# Patient Record
Sex: Male | Born: 1970 | Race: Black or African American | Hispanic: No | Marital: Married | Smoking: Current every day smoker
Health system: Southern US, Community
[De-identification: ages and names within clinical notes are randomized; demographics above are authoritative.]

## PROBLEM LIST (undated history)

## (undated) DIAGNOSIS — K219 Gastro-esophageal reflux disease without esophagitis: Secondary | ICD-10-CM

## (undated) DIAGNOSIS — F1910 Other psychoactive substance abuse, uncomplicated: Secondary | ICD-10-CM

## (undated) DIAGNOSIS — F1011 Alcohol abuse, in remission: Secondary | ICD-10-CM

## (undated) DIAGNOSIS — F32A Depression, unspecified: Secondary | ICD-10-CM

## (undated) HISTORY — PX: MOUTH SURGERY: SHX715

---

## 2007-01-18 ENCOUNTER — Emergency Department: Payer: Self-pay | Admitting: Emergency Medicine

## 2008-01-22 ENCOUNTER — Emergency Department: Payer: Self-pay

## 2008-08-28 ENCOUNTER — Emergency Department: Payer: Self-pay | Admitting: Emergency Medicine

## 2008-09-15 ENCOUNTER — Emergency Department: Payer: Self-pay | Admitting: Internal Medicine

## 2008-11-23 ENCOUNTER — Inpatient Hospital Stay: Payer: Self-pay | Admitting: Psychiatry

## 2009-02-23 DEATH — deceased

## 2009-04-08 ENCOUNTER — Inpatient Hospital Stay: Payer: Self-pay | Admitting: Unknown Physician Specialty

## 2009-07-22 ENCOUNTER — Emergency Department: Payer: Self-pay | Admitting: Internal Medicine

## 2009-07-23 ENCOUNTER — Emergency Department: Payer: Self-pay | Admitting: Emergency Medicine

## 2009-08-26 ENCOUNTER — Inpatient Hospital Stay: Payer: Self-pay | Admitting: Unknown Physician Specialty

## 2010-02-24 ENCOUNTER — Emergency Department (HOSPITAL_COMMUNITY)
Admission: EM | Admit: 2010-02-24 | Discharge: 2010-02-24 | Payer: Self-pay | Source: Home / Self Care | Admitting: Emergency Medicine

## 2010-03-04 ENCOUNTER — Emergency Department: Payer: Self-pay | Admitting: Emergency Medicine

## 2010-04-10 ENCOUNTER — Emergency Department: Payer: Self-pay | Admitting: Emergency Medicine

## 2010-04-14 ENCOUNTER — Inpatient Hospital Stay: Payer: Self-pay | Admitting: Psychiatry

## 2010-07-06 ENCOUNTER — Emergency Department: Payer: Self-pay | Admitting: Emergency Medicine

## 2010-09-14 ENCOUNTER — Emergency Department: Payer: Self-pay | Admitting: Emergency Medicine

## 2010-10-07 ENCOUNTER — Emergency Department: Payer: Self-pay | Admitting: Emergency Medicine

## 2010-10-18 ENCOUNTER — Emergency Department (HOSPITAL_COMMUNITY)
Admission: EM | Admit: 2010-10-18 | Discharge: 2010-10-18 | Disposition: A | Discharge status: Home / Self Care | Payer: Self-pay | Attending: Emergency Medicine | Admitting: Emergency Medicine

## 2010-10-18 DIAGNOSIS — F3289 Other specified depressive episodes: Secondary | ICD-10-CM | POA: Insufficient documentation

## 2010-10-18 DIAGNOSIS — S335XXA Sprain of ligaments of lumbar spine, initial encounter: Secondary | ICD-10-CM | POA: Insufficient documentation

## 2010-10-18 DIAGNOSIS — M545 Low back pain, unspecified: Secondary | ICD-10-CM | POA: Insufficient documentation

## 2010-10-18 DIAGNOSIS — W108XXA Fall (on) (from) other stairs and steps, initial encounter: Secondary | ICD-10-CM | POA: Insufficient documentation

## 2010-10-18 DIAGNOSIS — S5010XA Contusion of unspecified forearm, initial encounter: Secondary | ICD-10-CM | POA: Insufficient documentation

## 2010-10-18 DIAGNOSIS — Y92009 Unspecified place in unspecified non-institutional (private) residence as the place of occurrence of the external cause: Secondary | ICD-10-CM | POA: Insufficient documentation

## 2010-10-18 DIAGNOSIS — R209 Unspecified disturbances of skin sensation: Secondary | ICD-10-CM | POA: Insufficient documentation

## 2010-10-18 DIAGNOSIS — F329 Major depressive disorder, single episode, unspecified: Secondary | ICD-10-CM | POA: Insufficient documentation

## 2010-10-19 ENCOUNTER — Emergency Department: Payer: Self-pay | Admitting: Emergency Medicine

## 2010-11-09 ENCOUNTER — Emergency Department: Payer: Self-pay | Admitting: *Deleted

## 2010-11-09 ENCOUNTER — Emergency Department: Payer: Self-pay | Admitting: Emergency Medicine

## 2010-11-28 ENCOUNTER — Emergency Department: Payer: Self-pay | Admitting: Internal Medicine

## 2010-12-03 ENCOUNTER — Emergency Department: Payer: Self-pay | Admitting: *Deleted

## 2010-12-14 ENCOUNTER — Emergency Department: Payer: Self-pay | Admitting: *Deleted

## 2011-03-07 ENCOUNTER — Inpatient Hospital Stay: Payer: Self-pay | Admitting: Psychiatry

## 2011-03-07 LAB — CBC
HCT: 43.1 % (ref 40.0–52.0)
MCHC: 33.1 g/dL (ref 32.0–36.0)
MCV: 85 fL (ref 80–100)
RDW: 14.3 % (ref 11.5–14.5)

## 2011-03-07 LAB — COMPREHENSIVE METABOLIC PANEL
Albumin: 4.1 g/dL (ref 3.4–5.0)
Alkaline Phosphatase: 40 U/L — ABNORMAL LOW (ref 50–136)
Anion Gap: 9 (ref 7–16)
Calcium, Total: 8.3 mg/dL — ABNORMAL LOW (ref 8.5–10.1)
Co2: 29 mmol/L (ref 21–32)
EGFR (Non-African Amer.): >60
Glucose: 96 mg/dL (ref 65–99)
Osmolality: 285 (ref 275–301)
Potassium: 4 mmol/L (ref 3.5–5.1)
SGOT(AST): 23 U/L (ref 15–37)
Sodium: 144 mmol/L (ref 136–145)

## 2011-03-07 LAB — DRUG SCREEN, URINE
Amphetamines, Ur Screen: NEGATIVE (ref ?–1000)
Benzodiazepine, Ur Scrn: NEGATIVE (ref ?–200)
Cannabinoid 50 Ng, Ur ~~LOC~~: NEGATIVE (ref ?–50)
Cocaine Metabolite,Ur ~~LOC~~: NEGATIVE (ref ?–300)
MDMA (Ecstasy)Ur Screen: NEGATIVE (ref ?–500)
Opiate, Ur Screen: NEGATIVE (ref ?–300)
Phencyclidine (PCP) Ur S: NEGATIVE (ref ?–25)

## 2011-03-07 LAB — TSH: Thyroid Stimulating Horm: 0.32 u[IU]/mL — ABNORMAL LOW

## 2011-03-07 LAB — ACETAMINOPHEN LEVEL: Acetaminophen: <2 ug/mL

## 2011-03-10 LAB — LIPID PANEL
Cholesterol: 204 mg/dL — ABNORMAL HIGH (ref 0–200)
HDL Cholesterol: 62 mg/dL — ABNORMAL HIGH (ref 40–60)

## 2011-03-10 LAB — FOLATE: Folic Acid: 15.6 ng/mL (ref 3.1–100.0)

## 2011-04-25 ENCOUNTER — Emergency Department: Payer: Self-pay | Admitting: Emergency Medicine

## 2011-05-01 ENCOUNTER — Emergency Department: Payer: Self-pay | Admitting: Emergency Medicine

## 2011-05-01 LAB — COMPREHENSIVE METABOLIC PANEL
Anion Gap: 8 (ref 7–16)
Bilirubin,Total: 0.4 mg/dL (ref 0.2–1.0)
Calcium, Total: 8.6 mg/dL (ref 8.5–10.1)
Chloride: 107 mmol/L (ref 98–107)
Co2: 30 mmol/L (ref 21–32)
Creatinine: 1.02 mg/dL (ref 0.60–1.30)
EGFR (African American): >60
EGFR (Non-African Amer.): >60
Glucose: 92 mg/dL (ref 65–99)
Osmolality: 287 (ref 275–301)
SGOT(AST): 31 U/L (ref 15–37)
SGPT (ALT): 27 U/L

## 2011-05-01 LAB — ETHANOL: Ethanol %: 0.039 % (ref 0.000–0.080)

## 2011-05-01 LAB — CBC
MCH: 27.7 pg (ref 26.0–34.0)
Platelet: 214 10*3/uL (ref 150–440)
RBC: 5.15 10*6/uL (ref 4.40–5.90)
RDW: 15.5 % — ABNORMAL HIGH (ref 11.5–14.5)
WBC: 4.5 10*3/uL (ref 3.8–10.6)

## 2011-05-01 LAB — DRUG SCREEN, URINE
Barbiturates, Ur Screen: NEGATIVE (ref ?–200)
Cannabinoid 50 Ng, Ur ~~LOC~~: NEGATIVE (ref ?–50)
Cocaine Metabolite,Ur ~~LOC~~: NEGATIVE (ref ?–300)
MDMA (Ecstasy)Ur Screen: NEGATIVE (ref ?–500)
Methadone, Ur Screen: NEGATIVE (ref ?–300)
Opiate, Ur Screen: NEGATIVE (ref ?–300)
Phencyclidine (PCP) Ur S: NEGATIVE (ref ?–25)

## 2011-05-01 LAB — SALICYLATE LEVEL: Salicylates, Serum: <1.7 mg/dL

## 2011-05-01 LAB — RAPID HIV-1/2 QL/CONFIRM: HIV-1/2,Rapid Ql: NEGATIVE

## 2011-05-01 LAB — ACETAMINOPHEN LEVEL: Acetaminophen: <2 ug/mL

## 2011-05-26 ENCOUNTER — Emergency Department: Payer: Self-pay | Admitting: Emergency Medicine

## 2011-05-26 LAB — CBC
HGB: 13.8 g/dL (ref 13.0–18.0)
MCHC: 32.6 g/dL (ref 32.0–36.0)
MCV: 85 fL (ref 80–100)
Platelet: 182 10*3/uL (ref 150–440)
RBC: 4.96 10*6/uL (ref 4.40–5.90)
RDW: 15.3 % — ABNORMAL HIGH (ref 11.5–14.5)

## 2011-05-26 LAB — BASIC METABOLIC PANEL
Anion Gap: 9 (ref 7–16)
BUN: 4 mg/dL — ABNORMAL LOW (ref 7–18)
Calcium, Total: 8.4 mg/dL — ABNORMAL LOW (ref 8.5–10.1)
Co2: 26 mmol/L (ref 21–32)
Creatinine: 1.1 mg/dL (ref 0.60–1.30)
Glucose: 100 mg/dL — ABNORMAL HIGH (ref 65–99)
Osmolality: 276 (ref 275–301)
Potassium: 4 mmol/L (ref 3.5–5.1)

## 2011-05-26 LAB — DRUG SCREEN, URINE
Amphetamines, Ur Screen: NEGATIVE (ref ?–1000)
Barbiturates, Ur Screen: NEGATIVE (ref ?–200)
Benzodiazepine, Ur Scrn: NEGATIVE (ref ?–200)
Cocaine Metabolite,Ur ~~LOC~~: POSITIVE (ref ?–300)
MDMA (Ecstasy)Ur Screen: NEGATIVE (ref ?–500)
Methadone, Ur Screen: NEGATIVE (ref ?–300)
Opiate, Ur Screen: NEGATIVE (ref ?–300)
Tricyclic, Ur Screen: NEGATIVE (ref ?–1000)

## 2011-06-01 ENCOUNTER — Emergency Department: Payer: Self-pay

## 2011-06-01 LAB — CBC
HCT: 47.7 % (ref 40.0–52.0)
HGB: 15.4 g/dL (ref 13.0–18.0)
MCH: 27.6 pg (ref 26.0–34.0)
MCV: 86 fL (ref 80–100)
Platelet: 245 10*3/uL (ref 150–440)
RBC: 5.56 10*6/uL (ref 4.40–5.90)
RDW: 15.8 % — ABNORMAL HIGH (ref 11.5–14.5)
WBC: 8.5 10*3/uL (ref 3.8–10.6)

## 2011-06-01 LAB — URINALYSIS, COMPLETE
Bilirubin,UR: NEGATIVE
Blood: NEGATIVE
Hyaline Cast: 1
Leukocyte Esterase: NEGATIVE
Nitrite: NEGATIVE
Protein: NEGATIVE
Squamous Epithelial: NONE SEEN
WBC UR: 1 /HPF (ref 0–5)

## 2011-06-01 LAB — COMPREHENSIVE METABOLIC PANEL
Albumin: 4.9 g/dL (ref 3.4–5.0)
BUN: 7 mg/dL (ref 7–18)
Calcium, Total: 9.8 mg/dL (ref 8.5–10.1)
Chloride: 98 mmol/L (ref 98–107)
Potassium: 4.2 mmol/L (ref 3.5–5.1)
SGOT(AST): 24 U/L (ref 15–37)
SGPT (ALT): 24 U/L
Total Protein: 8.7 g/dL — ABNORMAL HIGH (ref 6.4–8.2)

## 2011-06-01 LAB — ETHANOL: Ethanol %: <0.003 % (ref 0.000–0.080)

## 2011-06-01 LAB — DRUG SCREEN, URINE
Amphetamines, Ur Screen: NEGATIVE (ref ?–1000)
Cocaine Metabolite,Ur ~~LOC~~: NEGATIVE (ref ?–300)
MDMA (Ecstasy)Ur Screen: NEGATIVE (ref ?–500)
Opiate, Ur Screen: NEGATIVE (ref ?–300)
Phencyclidine (PCP) Ur S: NEGATIVE (ref ?–25)
Tricyclic, Ur Screen: NEGATIVE (ref ?–1000)

## 2011-06-04 ENCOUNTER — Inpatient Hospital Stay: Payer: Self-pay | Admitting: Psychiatry

## 2011-06-04 LAB — SALICYLATE LEVEL: Salicylates, Serum: <1.7 mg/dL

## 2011-06-04 LAB — CBC
HCT: 48.8 % (ref 40.0–52.0)
HGB: 15.9 g/dL (ref 13.0–18.0)
MCV: 85 fL (ref 80–100)
Platelet: 200 10*3/uL (ref 150–440)
RBC: 5.77 10*6/uL (ref 4.40–5.90)
RDW: 15.5 % — ABNORMAL HIGH (ref 11.5–14.5)
WBC: 6.7 10*3/uL (ref 3.8–10.6)

## 2011-06-04 LAB — COMPREHENSIVE METABOLIC PANEL
Albumin: 4.5 g/dL (ref 3.4–5.0)
Bilirubin,Total: 0.4 mg/dL (ref 0.2–1.0)
Calcium, Total: 9.1 mg/dL (ref 8.5–10.1)
Creatinine: 0.99 mg/dL (ref 0.60–1.30)
EGFR (African American): >60
EGFR (Non-African Amer.): >60
Glucose: 93 mg/dL (ref 65–99)
Osmolality: 273 (ref 275–301)
SGOT(AST): 20 U/L (ref 15–37)
SGPT (ALT): 23 U/L
Sodium: 138 mmol/L (ref 136–145)
Total Protein: 8.5 g/dL — ABNORMAL HIGH (ref 6.4–8.2)

## 2011-06-04 LAB — ACETAMINOPHEN LEVEL: Acetaminophen: <2 ug/mL

## 2011-06-04 LAB — ETHANOL: Ethanol %: 0.075 % (ref 0.000–0.080)

## 2011-06-04 LAB — DRUG SCREEN, URINE
Benzodiazepine, Ur Scrn: NEGATIVE (ref ?–200)
Cannabinoid 50 Ng, Ur ~~LOC~~: NEGATIVE (ref ?–50)
Cocaine Metabolite,Ur ~~LOC~~: NEGATIVE (ref ?–300)
MDMA (Ecstasy)Ur Screen: NEGATIVE (ref ?–500)
Methadone, Ur Screen: NEGATIVE (ref ?–300)
Tricyclic, Ur Screen: NEGATIVE (ref ?–1000)

## 2011-06-09 LAB — LIPID PANEL
HDL Cholesterol: 43 mg/dL (ref 40–60)
Ldl Cholesterol, Calc: 108 mg/dL — ABNORMAL HIGH (ref 0–100)
Triglycerides: 202 mg/dL — ABNORMAL HIGH (ref 0–200)
VLDL Cholesterol, Calc: 40 mg/dL (ref 5–40)

## 2011-09-21 ENCOUNTER — Emergency Department: Payer: Self-pay | Admitting: Emergency Medicine

## 2011-10-06 ENCOUNTER — Emergency Department: Payer: Self-pay | Admitting: Emergency Medicine

## 2011-10-07 ENCOUNTER — Encounter (HOSPITAL_COMMUNITY): Payer: Self-pay | Admitting: *Deleted

## 2011-10-07 ENCOUNTER — Emergency Department (HOSPITAL_COMMUNITY)
Admission: EM | Admit: 2011-10-07 | Discharge: 2011-10-07 | Disposition: A | Discharge status: Home / Self Care | Payer: Self-pay | Attending: Emergency Medicine | Admitting: Emergency Medicine

## 2011-10-07 DIAGNOSIS — M5380 Other specified dorsopathies, site unspecified: Secondary | ICD-10-CM | POA: Insufficient documentation

## 2011-10-07 DIAGNOSIS — M62830 Muscle spasm of back: Secondary | ICD-10-CM

## 2011-10-07 MED ORDER — HYDROCODONE-ACETAMINOPHEN 5-325 MG PO TABS: ORAL_TABLET | ORAL | Status: AC

## 2011-10-07 MED ORDER — IBUPROFEN 400 MG PO TABS
600.0000 mg | ORAL_TABLET | Freq: Once | ORAL | Status: AC
  Administered 2011-10-07: 600 mg via ORAL
  Filled 2011-10-07: qty 1

## 2011-10-07 MED ORDER — DIAZEPAM 5 MG PO TABS: 5.0000 mg | ORAL_TABLET | Freq: Three times a day (TID) | ORAL | Status: AC | PRN

## 2011-10-07 NOTE — Discharge Instructions (Signed)
Lumbosacral Strain Lumbosacral strain is one of the most common causes of back pain. There are many causes of back pain. Most are not serious conditions. CAUSES  Your backbone (spinal column) is made up of 24 main vertebral bodies, the sacrum, and the coccyx. These are held together by muscles and tough, fibrous tissue (ligaments). Nerve roots pass through the openings between the vertebrae. A sudden move or injury to the back may cause injury to, or pressure on, these nerves. This may result in localized back pain or pain movement (radiation) into the buttocks, down the leg, and into the foot. Sharp, shooting pain from the buttock down the back of the leg (sciatica) is frequently associated with a ruptured (herniated) disk. Pain may be caused by muscle spasm alone. Your caregiver can often find the cause of your pain by the details of your symptoms and an exam. In some cases, you may need tests (such as X-rays). Your caregiver will work with you to decide if any tests are needed based on your specific exam. HOME CARE INSTRUCTIONS   Avoid an underactive lifestyle. Active exercise, as directed by your caregiver, is your greatest weapon against back pain.   Avoid hard physical activities (tennis, racquetball, waterskiing) if you are not in proper physical condition for it. This may aggravate or create problems.   If you have a back problem, avoid sports requiring sudden body movements. Swimming and walking are generally safer activities.   Maintain good posture.   Avoid becoming overweight (obese).   Use bed rest for only the most extreme, sudden (acute) episode. Your caregiver will help you determine how much bed rest is necessary.   For acute conditions, you may put ice on the injured area.   Put ice in a plastic bag.   Place a towel between your skin and the bag.   Leave the ice on for 15 to 20 minutes at a time, every 2 hours, or as needed.   After you are improved and more active, it  may help to apply heat for 30 minutes before activities.  See your caregiver if you are having pain that lasts longer than expected. Your caregiver can advise appropriate exercises or therapy if needed. With conditioning, most back problems can be avoided. SEEK IMMEDIATE MEDICAL CARE IF:   You have numbness, tingling, weakness, or problems with the use of your arms or legs.   You experience severe back pain not relieved with medicines.   There is a change in bowel or bladder control.   You have increasing pain in any area of the body, including your belly (abdomen).   You notice shortness of breath, dizziness, or feel faint.   You feel sick to your stomach (nauseous), are throwing up (vomiting), or become sweaty.   You notice discoloration of your toes or legs, or your feet get very cold.   Your back pain is getting worse.   You have a fever.  MAKE SURE YOU:   Understand these instructions.   Will watch your condition.   Will get help right away if you are not doing well or get worse.  Document Released: 11/19/2004 Document Revised: 01/29/2011 Document Reviewed: 05/11/2008 [facility] Patient Information 2012 [place], [place].   Narcotic and benzodiazepine use may cause drowsiness, slowed breathing or dependence.  Please use with caution and do not drive, operate machinery or watch young children alone while taking them.  Taking combinations of these medications or drinking alcohol will potentiate these effects.

## 2011-10-07 NOTE — ED Notes (Signed)
Denies any bowel or urinary complaints steady gait.

## 2011-10-07 NOTE — ED Notes (Signed)
Reports lower back pain since Monday, unsure of injury to back, is radiating down left leg. Ambulatory at triage.

## 2011-10-07 NOTE — ED Provider Notes (Signed)
History   This chart was scribed for Shaun Colon. [name], MD by Charolett Bumpers . The patient was seen in room TR11C/TR11C. Patient's care was started at 1059.    CSN: [id number]  Arrival date & time 10/07/11  0846   First MD Initiated Contact with Patient 10/07/11 1059      Chief Complaint  Patient presents with  . Back Pain    (Consider location/radiation/quality/duration/timing/severity/associated sxs/prior treatment) HPI THANG FLETT is a 41 y.o. male who presents to the Emergency Department complaining of constant, moderate lower back pain with an onset of 2 days ago. Pt reports that the back pain radiates down his left leg. 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 Pt describes the back pain as throbbing. Pt states that his symptoms are aggravated with twisting and bending. Pt states that he able to ambulate but with pain. Pt reports a h/o a similar back injury with similar back pain. Pt denies any fevers, chill, or incontinence. Pt reports taking Tylenol and Ibuprofen for his symptoms with some relief.   History reviewed. No pertinent past medical history.  History reviewed. No pertinent past surgical history.  History reviewed. No pertinent family history.  History  Substance Use Topics  . Smoking status: Not on file  . Smokeless tobacco: Not on file  . Alcohol Use: No      Review of Systems  Constitutional: Negative for fever and chills.  Respiratory: Negative for shortness of breath.   Gastrointestinal: Negative for nausea and vomiting.  Musculoskeletal: Positive for back pain.  Neurological: Negative for weakness.  All other systems reviewed and are negative.    Allergies  Review of patient's allergies indicates no known allergies.  Home Medications   Current Outpatient Rx  Name Route Sig Dispense Refill  . FLUOXETINE HCL 40 MG PO CAPS Oral Take 40 mg by mouth daily.    . QUETIAPINE FUMARATE 200 MG PO TABS Oral Take 200 mg by mouth  at bedtime.    Marland Kitchen DIAZEPAM 5 MG PO TABS Oral Take 1 tablet (5 mg total) by mouth every 8 (eight) hours as needed (muscle spasms). 15 tablet 0  . HYDROCODONE-ACETAMINOPHEN 5-325 MG PO TABS  1-2 tablets po q 6 hours prn moderate to severe pain 20 tablet 0    BP 123/86  Pulse 90  Temp 98.1 F (36.7 C) (Oral)  Resp 16  SpO2 97%  Physical Exam  Nursing note and vitals reviewed. Constitutional: He is oriented to person, place, and time. He appears well-developed and well-nourished. No distress.  HENT:  Head: Normocephalic and atraumatic.  Eyes: EOM are normal.  Neck: Neck supple. No tracheal deviation present.  Cardiovascular: Normal rate.   Pulmonary/Chest: Effort normal. No respiratory distress.  Musculoskeletal: Normal range of motion. He exhibits tenderness.       Mild lumbar spinal tenderness.   Neurological: He is alert and oriented to person, place, and time.       Strength was normal. Reflexes normal.   Skin: Skin is warm and dry.  Psychiatric: He has a normal mood and affect. His behavior is normal.    ED Course  Procedures (including critical care time)  DIAGNOSTIC STUDIES: Oxygen Saturation is 97% on room air, normal by my interpretation.    COORDINATION OF CARE:  11:05-Discussed planned course of treatment with the patient, who is agreeable at this time.     Labs Reviewed - No data to display No results found.  1. Lumbar paraspinal muscle spasm       MDM  I personally performed the services described in this documentation, which was scribed in my presence. The recorded information has been reviewed and considered.   No distal weakness, ok reflexes, no cauda equina symptoms.  RICE, Rx for analgesics and relaxants provided. no need for imaging.  Pt has had similar symptoms in the past.        Shaun Colon. Mecca Guitron, MD 10/12/11 1610

## 2011-10-14 ENCOUNTER — Emergency Department: Payer: Self-pay | Admitting: Emergency Medicine

## 2011-10-19 ENCOUNTER — Encounter (HOSPITAL_COMMUNITY): Payer: Self-pay | Admitting: Family Medicine

## 2011-10-19 ENCOUNTER — Emergency Department (HOSPITAL_COMMUNITY)
Admission: EM | Admit: 2011-10-19 | Discharge: 2011-10-19 | Disposition: A | Discharge status: Home / Self Care | Payer: Self-pay | Attending: Emergency Medicine | Admitting: Emergency Medicine

## 2011-10-19 DIAGNOSIS — Y998 Other external cause status: Secondary | ICD-10-CM | POA: Insufficient documentation

## 2011-10-19 DIAGNOSIS — Y9389 Activity, other specified: Secondary | ICD-10-CM | POA: Insufficient documentation

## 2011-10-19 DIAGNOSIS — S39012A Strain of muscle, fascia and tendon of lower back, initial encounter: Secondary | ICD-10-CM

## 2011-10-19 DIAGNOSIS — X500XXA Overexertion from strenuous movement or load, initial encounter: Secondary | ICD-10-CM | POA: Insufficient documentation

## 2011-10-19 DIAGNOSIS — S335XXA Sprain of ligaments of lumbar spine, initial encounter: Secondary | ICD-10-CM | POA: Insufficient documentation

## 2011-10-19 MED ORDER — HYDROCODONE-ACETAMINOPHEN 5-325 MG PO TABS: ORAL_TABLET | ORAL | Status: AC

## 2011-10-19 MED ORDER — CYCLOBENZAPRINE HCL 10 MG PO TABS: 10.0000 mg | ORAL_TABLET | Freq: Three times a day (TID) | ORAL | Status: AC | PRN

## 2011-10-19 NOTE — ED Notes (Signed)
C/o low back pain radiating down LLE x 3 weeks after lifting something heavy. Seen in ED for same 10-07-11 but states pain isn't getting any better. Pt requesting work note so that he can rest longer to let it heal.  Pt by himself & drove self here to ED.

## 2011-10-19 NOTE — ED Notes (Signed)
Per pt lower back pain x a few weeks and radiating down his left leg

## 2011-10-19 NOTE — ED Provider Notes (Signed)
History   This chart was scribed for [name]. [name], MD by [name]. The patient was seen in room TR06C/TR06C and the patient's care was started at 12:43PM.    CSN: [id number]  Arrival date & time 10/19/11  1154   First MD Initiated Contact with Patient 10/19/11 1234      Chief Complaint  Patient presents with  . Back Pain    (Consider location/radiation/quality/duration/timing/severity/associated sxs/prior treatment) The history is provided by the patient. No language interpreter was used.   Shaun Colon is a 41 y.o. male who presents to the Emergency Department complaining of constant, moderate to severe middle to lower back pain that radiates down the left leg with an onset 2 week ago. Pt was here at the ED for the same symptoms around onset. Pt was given pain meds and relaxants which did alleviate the pain, but pt states that he went back to work too soon, causing the pain to come back. Pt's work requires lifting heavy trailers. No HA, fever, neck pain, sore throat, rash, CP, SOB, abd pain, n/v/d, dysuria, urinary or bowel dysfunction, or extremity edema, weakness, numbness, or tingling. No known allergies. No other pertinent medical symptoms.  No PCP  History reviewed. No pertinent past medical history.  History reviewed. No pertinent past surgical history.  History reviewed. No pertinent family history.  History  Substance Use Topics  . Smoking status: Not on file  . Smokeless tobacco: Not on file  . Alcohol Use: No      Review of Systems  Musculoskeletal: Positive for back pain.   10 Systems reviewed and all are negative for acute change except as noted in the HPI.  Allergies  Review of patient's allergies indicates no known allergies.  Home Medications   Current Outpatient Rx  Name Route Sig Dispense Refill  . FLUOXETINE HCL 40 MG PO CAPS Oral Take 40 mg by mouth daily.    . QUETIAPINE FUMARATE 200 MG PO TABS Oral Take 200 mg by mouth at bedtime.      Marland Kitchen ZOLPIDEM TARTRATE 10 MG PO TABS Oral Take 10 mg by mouth at bedtime as needed.    . CYCLOBENZAPRINE HCL 10 MG PO TABS Oral Take 1 tablet (10 mg total) by mouth 3 (three) times daily as needed for muscle spasms. 15 tablet 0  . HYDROCODONE-ACETAMINOPHEN 5-325 MG PO TABS  1-2 tablets po q 6 hours prn moderate to severe pain 20 tablet 0    BP 145/90  Pulse 95  Temp 98 F (36.7 C)  Resp 18  SpO2 97%  Physical Exam  Nursing note and vitals reviewed. Constitutional: He is oriented to person, place, and time. He appears well-developed and well-nourished. No distress.  HENT:  Head: Normocephalic and atraumatic.  Eyes: EOM are normal.  Neck: Neck supple. No tracheal deviation present.  Cardiovascular: Normal rate.   Pulmonary/Chest: Effort normal. No respiratory distress.  Musculoskeletal: Normal range of motion. He exhibits tenderness (Bilateral paraspinal tenderness that is worse on the left side).  Neurological: He is alert and oriented to person, place, and time.       2+ reflexes, nml strength and sensation of lower extremities.  Skin: Skin is warm and dry.  Psychiatric: He has a normal mood and affect. His behavior is normal.    ED Course  Procedures (including critical care time)  DIAGNOSTIC STUDIES: Oxygen Saturation is 97% on room air, normal by my interpretation.    COORDINATION OF CARE:  12:48PM - pain  meds and flexeril will be Rx for the pt. Pt ready for d/c.   Labs Reviewed - No data to display No results found.   1. Lumbar strain       MDM  I personally performed the services described in this documentation, which was scribed in my presence. The recorded information has been reviewed and considered.     Pt seen about 1.5 weeks ago by me.  No sig new change, attributes to going back to work too soon.  Pt will take longer time off.  Rx's given.  Pt knows to try to find a PCP locally for ongoing therapy, possible referral to PT.  No urinary symptoms,  weakness, fever.          [name]. Monna Crean, MD 10/20/11 8119

## 2011-10-19 NOTE — Discharge Instructions (Signed)
 Back Exercises Back exercises help treat and prevent back injuries. The goal of back exercises is to increase the strength of your abdominal and back muscles and the flexibility of your back. These exercises should be started when you no longer have back pain. Back exercises include:  Pelvic Tilt. Lie on your back with your knees bent. Tilt your pelvis until the lower part of your back is against the floor. Hold this position 5 to 10 sec and repeat 5 to 10 times.   Knee to Chest. Pull first 1 knee up against your chest and hold for 20 to 30 seconds, repeat this with the other knee, and then both knees. This may be done with the other leg straight or bent, whichever feels better.   Sit-Ups or Curl-Ups. Bend your knees 90 degrees. Start with tilting your pelvis, and do a partial, slow sit-up, lifting your trunk only 30 to 45 degrees off the floor. Take at least 2 to 3 seconds for each sit-up. Do not do sit-ups with your knees out straight. If partial sit-ups are difficult, simply do the above but with only tightening your abdominal muscles and holding it as directed.   Hip-Lift. Lie on your back with your knees flexed 90 degrees. Push down with your feet and shoulders as you raise your hips a couple inches off the floor; hold for 10 seconds, repeat 5 to 10 times.   Back arches. Lie on your stomach, propping yourself up on bent elbows. Slowly press on your hands, causing an arch in your low back. Repeat 3 to 5 times. Any initial stiffness and discomfort should lessen with repetition over time.   Shoulder-Lifts. Lie face down with arms beside your body. Keep hips and torso pressed to floor as you slowly lift your head and shoulders off the floor.  Do not overdo your exercises, especially in the beginning. Exercises may cause you some mild back discomfort which lasts for a few minutes; however, if the pain is more severe, or lasts for more than 15 minutes, do not continue exercises until you see your  caregiver. Improvement with exercise therapy for back problems is slow.  See your caregivers for assistance with developing a proper back exercise program. Document Released: 03/19/2004 Document Revised: 01/29/2011 Document Reviewed: 02/09/2005 [facility] Patient Information 2012 [place], [place].    Use caution as flexeril  and Norco can both cause drowsiness or slowed breathing.  Do not drink alcohol when on these medications and be sure to use caution if you're taking anxiety medications or sleep aids as well.

## 2011-12-05 ENCOUNTER — Emergency Department: Payer: Self-pay | Admitting: Emergency Medicine

## 2011-12-05 LAB — COMPREHENSIVE METABOLIC PANEL
Anion Gap: 8 (ref 7–16)
BUN: 12 mg/dL (ref 7–18)
Calcium, Total: 8.7 mg/dL (ref 8.5–10.1)
Chloride: 107 mmol/L (ref 98–107)
Co2: 27 mmol/L (ref 21–32)
EGFR (African American): >60
EGFR (Non-African Amer.): >60
SGOT(AST): 15 U/L (ref 15–37)
SGPT (ALT): 21 U/L (ref 12–78)

## 2011-12-05 LAB — TSH: Thyroid Stimulating Horm: 0.84 u[IU]/mL

## 2011-12-05 LAB — DRUG SCREEN, URINE
Cannabinoid 50 Ng, Ur ~~LOC~~: NEGATIVE (ref ?–50)
Cocaine Metabolite,Ur ~~LOC~~: POSITIVE (ref ?–300)
MDMA (Ecstasy)Ur Screen: NEGATIVE (ref ?–500)
Opiate, Ur Screen: NEGATIVE (ref ?–300)
Phencyclidine (PCP) Ur S: NEGATIVE (ref ?–25)

## 2011-12-05 LAB — URINALYSIS, COMPLETE
Bilirubin,UR: NEGATIVE
Leukocyte Esterase: NEGATIVE
Ph: 5 (ref 4.5–8.0)
Protein: NEGATIVE
RBC,UR: <1 /HPF (ref 0–5)
Squamous Epithelial: NONE SEEN

## 2011-12-05 LAB — CBC
MCHC: 33.2 g/dL (ref 32.0–36.0)
Platelet: 192 10*3/uL (ref 150–440)
RDW: 15.5 % — ABNORMAL HIGH (ref 11.5–14.5)
WBC: 7.5 10*3/uL (ref 3.8–10.6)

## 2011-12-05 LAB — ETHANOL: Ethanol %: <0.003 % (ref 0.000–0.080)

## 2012-01-02 LAB — CBC
MCH: 26.5 pg (ref 26.0–34.0)
MCHC: 32.3 g/dL (ref 32.0–36.0)
MCV: 82 fL (ref 80–100)
Platelet: 206 10*3/uL (ref 150–440)
RBC: 5.09 10*6/uL (ref 4.40–5.90)

## 2012-01-02 LAB — COMPREHENSIVE METABOLIC PANEL
Albumin: 4.2 g/dL (ref 3.4–5.0)
Anion Gap: 9 (ref 7–16)
BUN: 12 mg/dL (ref 7–18)
Chloride: 107 mmol/L (ref 98–107)
Creatinine: 1.28 mg/dL (ref 0.60–1.30)
EGFR (African American): >60
Glucose: 77 mg/dL (ref 65–99)
Osmolality: 278 (ref 275–301)
Potassium: 3.6 mmol/L (ref 3.5–5.1)
SGOT(AST): 18 U/L (ref 15–37)
Total Protein: 7.6 g/dL (ref 6.4–8.2)

## 2012-01-02 LAB — URINALYSIS, COMPLETE
Bilirubin,UR: NEGATIVE
Ketone: NEGATIVE
Ph: 6 (ref 4.5–8.0)
RBC,UR: <1 /HPF (ref 0–5)
Specific Gravity: 1.001 (ref 1.003–1.030)
Squamous Epithelial: NONE SEEN
WBC UR: <1 /HPF (ref 0–5)

## 2012-01-02 LAB — DRUG SCREEN, URINE
Benzodiazepine, Ur Scrn: NEGATIVE (ref ?–200)
Cannabinoid 50 Ng, Ur ~~LOC~~: NEGATIVE (ref ?–50)
MDMA (Ecstasy)Ur Screen: NEGATIVE (ref ?–500)
Phencyclidine (PCP) Ur S: NEGATIVE (ref ?–25)

## 2012-01-02 LAB — ETHANOL
Ethanol %: 0.178 % — ABNORMAL HIGH (ref 0.000–0.080)
Ethanol: 178 mg/dL

## 2012-01-03 ENCOUNTER — Inpatient Hospital Stay: Payer: Self-pay | Admitting: Psychiatry

## 2012-01-16 ENCOUNTER — Emergency Department: Payer: Self-pay | Admitting: Emergency Medicine

## 2012-01-16 LAB — DRUG SCREEN, URINE
Amphetamines, Ur Screen: NEGATIVE (ref ?–1000)
Cocaine Metabolite,Ur ~~LOC~~: POSITIVE (ref ?–300)
Opiate, Ur Screen: NEGATIVE (ref ?–300)
Tricyclic, Ur Screen: NEGATIVE (ref ?–1000)

## 2012-01-16 LAB — COMPREHENSIVE METABOLIC PANEL
Albumin: 4.4 g/dL (ref 3.4–5.0)
Anion Gap: 7 (ref 7–16)
Glucose: 111 mg/dL — ABNORMAL HIGH (ref 65–99)
Osmolality: 283 (ref 275–301)
Potassium: 4 mmol/L (ref 3.5–5.1)
Sodium: 142 mmol/L (ref 136–145)
Total Protein: 8.1 g/dL (ref 6.4–8.2)

## 2012-01-16 LAB — URINALYSIS, COMPLETE
Bilirubin,UR: NEGATIVE
Ketone: NEGATIVE
Leukocyte Esterase: NEGATIVE
Ph: 5 (ref 4.5–8.0)
Protein: NEGATIVE
RBC,UR: NONE SEEN /HPF (ref 0–5)
WBC UR: <1 /HPF (ref 0–5)

## 2012-01-16 LAB — ETHANOL
Ethanol %: 0.157 % — ABNORMAL HIGH (ref 0.000–0.080)
Ethanol: 157 mg/dL

## 2012-01-16 LAB — CBC
MCHC: 33 g/dL (ref 32.0–36.0)
RDW: 15.3 % — ABNORMAL HIGH (ref 11.5–14.5)

## 2012-02-04 LAB — COMPREHENSIVE METABOLIC PANEL
Albumin: 4.4 g/dL (ref 3.4–5.0)
Alkaline Phosphatase: 64 U/L (ref 50–136)
Bilirubin,Total: 0.3 mg/dL (ref 0.2–1.0)
Chloride: 108 mmol/L — ABNORMAL HIGH (ref 98–107)
Co2: 25 mmol/L (ref 21–32)
Osmolality: 281 (ref 275–301)
Potassium: 3.4 mmol/L — ABNORMAL LOW (ref 3.5–5.1)
Sodium: 141 mmol/L (ref 136–145)

## 2012-02-04 LAB — OSMOLALITY: Osmolality: 323 mOsm/kg (ref 275–295)

## 2012-02-04 LAB — ACETAMINOPHEN LEVEL: Acetaminophen: <2 ug/mL

## 2012-02-04 LAB — CBC: MCHC: 32.9 g/dL (ref 32.0–36.0)

## 2012-02-04 LAB — SALICYLATE LEVEL: Salicylates, Serum: <1.7 mg/dL

## 2012-02-04 LAB — DRUG SCREEN, URINE
Amphetamines, Ur Screen: NEGATIVE (ref ?–1000)
Cannabinoid 50 Ng, Ur ~~LOC~~: NEGATIVE (ref ?–50)
Cocaine Metabolite,Ur ~~LOC~~: POSITIVE (ref ?–300)
MDMA (Ecstasy)Ur Screen: NEGATIVE (ref ?–500)
Phencyclidine (PCP) Ur S: NEGATIVE (ref ?–25)

## 2012-02-04 LAB — ETHANOL
Ethanol %: <0.003 % (ref 0.000–0.080)
Ethanol: <3 mg/dL

## 2012-02-04 LAB — TSH: Thyroid Stimulating Horm: 1.3 u[IU]/mL

## 2012-02-05 ENCOUNTER — Inpatient Hospital Stay: Payer: Self-pay | Admitting: Psychiatry

## 2012-02-13 ENCOUNTER — Emergency Department: Payer: Self-pay | Admitting: Internal Medicine

## 2012-02-13 LAB — COMPREHENSIVE METABOLIC PANEL
Alkaline Phosphatase: 66 U/L (ref 50–136)
BUN: 10 mg/dL (ref 7–18)
Bilirubin,Total: 0.2 mg/dL (ref 0.2–1.0)
Creatinine: 1.16 mg/dL (ref 0.60–1.30)
EGFR (African American): >60
EGFR (Non-African Amer.): >60
Glucose: 95 mg/dL (ref 65–99)
SGPT (ALT): 32 U/L (ref 12–78)
Total Protein: 7.7 g/dL (ref 6.4–8.2)

## 2012-02-13 LAB — DRUG SCREEN, URINE
Amphetamines, Ur Screen: NEGATIVE (ref ?–1000)
Barbiturates, Ur Screen: NEGATIVE (ref ?–200)
Cocaine Metabolite,Ur ~~LOC~~: POSITIVE (ref ?–300)
Opiate, Ur Screen: NEGATIVE (ref ?–300)
Tricyclic, Ur Screen: NEGATIVE (ref ?–1000)

## 2012-02-13 LAB — URINALYSIS, COMPLETE
Ketone: NEGATIVE
Nitrite: NEGATIVE
Ph: 5 (ref 4.5–8.0)
Protein: NEGATIVE
RBC,UR: NONE SEEN /HPF (ref 0–5)

## 2012-02-13 LAB — TSH: Thyroid Stimulating Horm: 0.275 u[IU]/mL — ABNORMAL LOW

## 2012-02-13 LAB — CBC
HCT: 41.1 % (ref 40.0–52.0)
WBC: 8 10*3/uL (ref 3.8–10.6)

## 2012-02-24 DIAGNOSIS — F10930 Alcohol use, unspecified with withdrawal, uncomplicated: Secondary | ICD-10-CM

## 2012-02-24 HISTORY — DX: Alcohol use, unspecified with withdrawal, uncomplicated: F10.930

## 2012-06-12 ENCOUNTER — Emergency Department: Payer: Self-pay | Admitting: Emergency Medicine

## 2012-06-12 LAB — URINALYSIS, COMPLETE
Blood: NEGATIVE
Glucose,UR: NEGATIVE mg/dL (ref 0–75)
Nitrite: NEGATIVE
Ph: 5 (ref 4.5–8.0)
RBC,UR: 23 /HPF (ref 0–5)
WBC UR: 429 /HPF (ref 0–5)

## 2012-06-12 LAB — COMPREHENSIVE METABOLIC PANEL
Albumin: 4.1 g/dL (ref 3.4–5.0)
Alkaline Phosphatase: 59 U/L (ref 50–136)
Anion Gap: 3 — ABNORMAL LOW (ref 7–16)
BUN: 7 mg/dL (ref 7–18)
Bilirubin,Total: 0.5 mg/dL (ref 0.2–1.0)
Chloride: 104 mmol/L (ref 98–107)
EGFR (African American): >60
Potassium: 3.6 mmol/L (ref 3.5–5.1)
SGOT(AST): 18 U/L (ref 15–37)
SGPT (ALT): 16 U/L (ref 12–78)
Total Protein: 7.8 g/dL (ref 6.4–8.2)

## 2012-06-12 LAB — CBC
HCT: 41.4 % (ref 40.0–52.0)
MCH: 26.9 pg (ref 26.0–34.0)
MCV: 82 fL (ref 80–100)
Platelet: 200 10*3/uL (ref 150–440)

## 2012-06-12 LAB — DRUG SCREEN, URINE
Amphetamines, Ur Screen: NEGATIVE (ref ?–1000)
Barbiturates, Ur Screen: NEGATIVE (ref ?–200)
Cannabinoid 50 Ng, Ur ~~LOC~~: NEGATIVE (ref ?–50)
Cocaine Metabolite,Ur ~~LOC~~: POSITIVE (ref ?–300)
MDMA (Ecstasy)Ur Screen: NEGATIVE (ref ?–500)
Methadone, Ur Screen: NEGATIVE (ref ?–300)
Opiate, Ur Screen: NEGATIVE (ref ?–300)
Phencyclidine (PCP) Ur S: NEGATIVE (ref ?–25)

## 2012-06-12 LAB — ETHANOL
Ethanol %: <0.003 % (ref 0.000–0.080)
Ethanol: <3 mg/dL

## 2012-06-12 LAB — TSH: Thyroid Stimulating Horm: 0.14 u[IU]/mL — ABNORMAL LOW

## 2012-09-01 LAB — URINALYSIS, COMPLETE
Bilirubin,UR: NEGATIVE
Blood: NEGATIVE
Hyaline Cast: 3
Ph: 5 (ref 4.5–8.0)
Protein: 30
RBC,UR: 1 /HPF (ref 0–5)
WBC UR: 3 /HPF (ref 0–5)

## 2012-09-01 LAB — CBC
HCT: 41.3 % (ref 40.0–52.0)
MCH: 27.1 pg (ref 26.0–34.0)
MCHC: 33.2 g/dL (ref 32.0–36.0)
MCV: 82 fL (ref 80–100)
Platelet: 225 10*3/uL (ref 150–440)
RBC: 5.07 10*6/uL (ref 4.40–5.90)
WBC: 6.2 10*3/uL (ref 3.8–10.6)

## 2012-09-01 LAB — DRUG SCREEN, URINE
Amphetamines, Ur Screen: NEGATIVE (ref ?–1000)
Barbiturates, Ur Screen: NEGATIVE (ref ?–200)
Benzodiazepine, Ur Scrn: NEGATIVE (ref ?–200)
MDMA (Ecstasy)Ur Screen: NEGATIVE (ref ?–500)
Phencyclidine (PCP) Ur S: NEGATIVE (ref ?–25)

## 2012-09-01 LAB — COMPREHENSIVE METABOLIC PANEL
Alkaline Phosphatase: 72 U/L (ref 50–136)
Co2: 27 mmol/L (ref 21–32)
Creatinine: 1.41 mg/dL — ABNORMAL HIGH (ref 0.60–1.30)
EGFR (Non-African Amer.): >60
Potassium: 3.6 mmol/L (ref 3.5–5.1)
SGPT (ALT): 16 U/L (ref 12–78)
Sodium: 137 mmol/L (ref 136–145)
Total Protein: 7.9 g/dL (ref 6.4–8.2)

## 2012-09-01 LAB — ETHANOL: Ethanol %: <0.003 % (ref 0.000–0.080)

## 2012-09-01 LAB — TSH: Thyroid Stimulating Horm: 1.23 u[IU]/mL

## 2012-09-01 LAB — SALICYLATE LEVEL: Salicylates, Serum: <1.7 mg/dL

## 2012-09-02 ENCOUNTER — Inpatient Hospital Stay: Payer: Self-pay | Admitting: Psychiatry

## 2012-09-04 LAB — CREATININE, SERUM
EGFR (African American): >60
EGFR (Non-African Amer.): >60

## 2013-09-29 ENCOUNTER — Emergency Department: Payer: Self-pay | Admitting: Emergency Medicine

## 2013-10-13 ENCOUNTER — Emergency Department: Payer: Self-pay | Admitting: Emergency Medicine

## 2013-10-13 LAB — COMPREHENSIVE METABOLIC PANEL
ALK PHOS: 45 U/L — AB
ALT: 14 U/L
Albumin: 3.9 g/dL (ref 3.4–5.0)
Anion Gap: 7 (ref 7–16)
BUN: 8 mg/dL (ref 7–18)
Bilirubin,Total: 0.5 mg/dL (ref 0.2–1.0)
CREATININE: 1.24 mg/dL (ref 0.60–1.30)
Calcium, Total: 8.4 mg/dL — ABNORMAL LOW (ref 8.5–10.1)
Chloride: 106 mmol/L (ref 98–107)
Co2: 28 mmol/L (ref 21–32)
EGFR (African American): >60
GLUCOSE: 82 mg/dL (ref 65–99)
Osmolality: 279 (ref 275–301)
POTASSIUM: 3.5 mmol/L (ref 3.5–5.1)
SGOT(AST): 21 U/L (ref 15–37)
SODIUM: 141 mmol/L (ref 136–145)
Total Protein: 7.1 g/dL (ref 6.4–8.2)

## 2013-10-13 LAB — ACETAMINOPHEN LEVEL: Acetaminophen: <2 ug/mL

## 2013-10-13 LAB — CBC
HCT: 40.4 % (ref 40.0–52.0)
HGB: 12.6 g/dL — ABNORMAL LOW (ref 13.0–18.0)
MCH: 25 pg — ABNORMAL LOW (ref 26.0–34.0)
MCHC: 31.1 g/dL — ABNORMAL LOW (ref 32.0–36.0)
MCV: 80 fL (ref 80–100)
PLATELETS: 193 10*3/uL (ref 150–440)
RBC: 5.03 10*6/uL (ref 4.40–5.90)
RDW: 15 % — AB (ref 11.5–14.5)
WBC: 5.3 10*3/uL (ref 3.8–10.6)

## 2013-10-13 LAB — ETHANOL
Ethanol %: <0.003 % (ref 0.000–0.080)
Ethanol: <3 mg/dL

## 2013-10-13 LAB — TSH: Thyroid Stimulating Horm: 0.73 u[IU]/mL

## 2013-10-13 LAB — SALICYLATE LEVEL: Salicylates, Serum: <1.7 mg/dL

## 2013-10-14 LAB — DRUG SCREEN, URINE
AMPHETAMINES, UR SCREEN: NEGATIVE (ref ?–1000)
BARBITURATES, UR SCREEN: NEGATIVE (ref ?–200)
Benzodiazepine, Ur Scrn: POSITIVE (ref ?–200)
Cannabinoid 50 Ng, Ur ~~LOC~~: NEGATIVE (ref ?–50)
Cocaine Metabolite,Ur ~~LOC~~: POSITIVE (ref ?–300)
MDMA (ECSTASY) UR SCREEN: NEGATIVE (ref ?–500)
Methadone, Ur Screen: NEGATIVE (ref ?–300)
Opiate, Ur Screen: NEGATIVE (ref ?–300)
Phencyclidine (PCP) Ur S: NEGATIVE (ref ?–25)
Tricyclic, Ur Screen: NEGATIVE (ref ?–1000)

## 2013-12-11 ENCOUNTER — Emergency Department: Payer: Self-pay | Admitting: Emergency Medicine

## 2013-12-25 ENCOUNTER — Inpatient Hospital Stay: Payer: Self-pay | Admitting: Psychiatry

## 2013-12-25 LAB — COMPREHENSIVE METABOLIC PANEL
ALK PHOS: 53 U/L
AST: 14 U/L — AB (ref 15–37)
Albumin: 3.8 g/dL (ref 3.4–5.0)
Anion Gap: 4 — ABNORMAL LOW (ref 7–16)
BILIRUBIN TOTAL: 0.2 mg/dL (ref 0.2–1.0)
BUN: 10 mg/dL (ref 7–18)
CALCIUM: 8.5 mg/dL (ref 8.5–10.1)
CHLORIDE: 106 mmol/L (ref 98–107)
Co2: 32 mmol/L (ref 21–32)
Creatinine: 1.44 mg/dL — ABNORMAL HIGH (ref 0.60–1.30)
EGFR (African American): >60
EGFR (Non-African Amer.): 57 — ABNORMAL LOW
Glucose: 69 mg/dL (ref 65–99)
Osmolality: 281 (ref 275–301)
Potassium: 3.9 mmol/L (ref 3.5–5.1)
SGPT (ALT): 19 U/L
Sodium: 142 mmol/L (ref 136–145)
TOTAL PROTEIN: 7.4 g/dL (ref 6.4–8.2)

## 2013-12-25 LAB — CBC
HCT: 50.4 % (ref 40.0–52.0)
HGB: 15.9 g/dL (ref 13.0–18.0)
MCH: 27.1 pg (ref 26.0–34.0)
MCHC: 31.6 g/dL — ABNORMAL LOW (ref 32.0–36.0)
MCV: 86 fL (ref 80–100)
PLATELETS: 213 10*3/uL (ref 150–440)
RBC: 5.87 10*6/uL (ref 4.40–5.90)
RDW: 16.5 % — AB (ref 11.5–14.5)
WBC: 6.1 10*3/uL (ref 3.8–10.6)

## 2013-12-25 LAB — URINALYSIS, COMPLETE
BLOOD: NEGATIVE
Bacteria: NONE SEEN
Bilirubin,UR: NEGATIVE
GLUCOSE, UR: NEGATIVE mg/dL (ref 0–75)
Hyaline Cast: 2
Ketone: NEGATIVE
LEUKOCYTE ESTERASE: NEGATIVE
NITRITE: NEGATIVE
PROTEIN: NEGATIVE
Ph: 6 (ref 4.5–8.0)
RBC,UR: <1 /HPF (ref 0–5)
SQUAMOUS EPITHELIAL: NONE SEEN
Specific Gravity: 1.018 (ref 1.003–1.030)
WBC UR: 3 /HPF (ref 0–5)

## 2013-12-25 LAB — DRUG SCREEN, URINE
Amphetamines, Ur Screen: NEGATIVE (ref ?–1000)
Barbiturates, Ur Screen: NEGATIVE (ref ?–200)
Benzodiazepine, Ur Scrn: POSITIVE (ref ?–200)
Cannabinoid 50 Ng, Ur ~~LOC~~: NEGATIVE (ref ?–50)
Cocaine Metabolite,Ur ~~LOC~~: POSITIVE (ref ?–300)
MDMA (ECSTASY) UR SCREEN: NEGATIVE (ref ?–500)
Methadone, Ur Screen: NEGATIVE (ref ?–300)
Opiate, Ur Screen: NEGATIVE (ref ?–300)
Phencyclidine (PCP) Ur S: NEGATIVE (ref ?–25)
Tricyclic, Ur Screen: NEGATIVE (ref ?–1000)

## 2013-12-25 LAB — SALICYLATE LEVEL: Salicylates, Serum: <1.7 mg/dL

## 2013-12-25 LAB — ETHANOL: Ethanol: <3 mg/dL

## 2013-12-25 LAB — ACETAMINOPHEN LEVEL: Acetaminophen: <2 ug/mL

## 2013-12-29 LAB — VALPROIC ACID LEVEL: Valproic Acid: 71 ug/mL

## 2014-02-13 IMAGING — CT CT HEAD WITHOUT CONTRAST
2 series · 16 of 30 positions shown, 20 images · non-contrast
Comparison: Head CT of 11/09/2010.

REASON FOR EXAM: FALL, STRUCK RIGHT HEAD
COMMENTS:   May transport without cardiac monitor

PROCEDURE:     CT  - CT HEAD WITHOUT CONTRAST  - April 25, 2011  [DATE]
RESULT:
HISTORY: Fall.

[Series 2: without · axial · non-contrast · 0.43mm/px · z∈[-188,-58]mm · 13 of 32 slices shown, 17 images]
[im 3/32  brain]
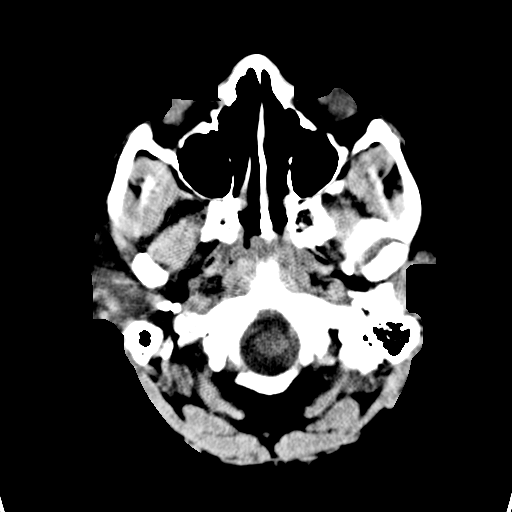
[im 3/32  bone]
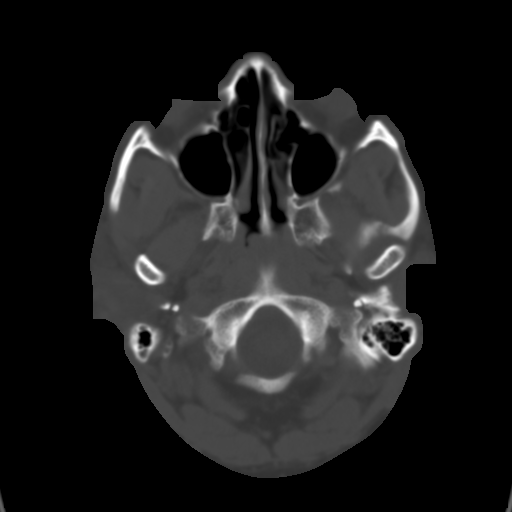
[im 5/32  brain]
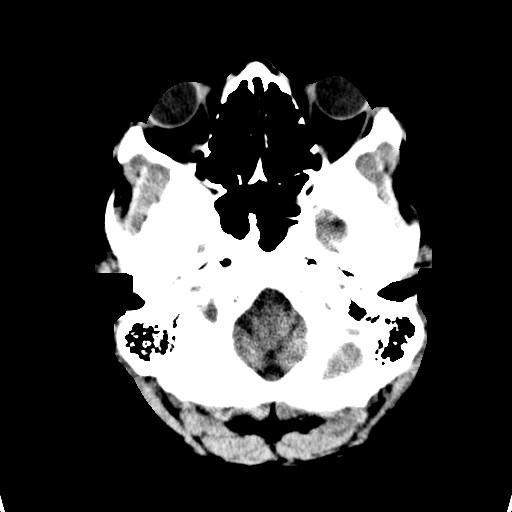
[im 7/32  brain]
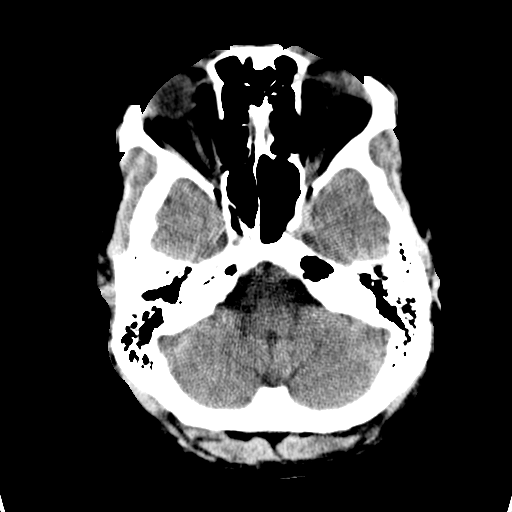
[im 9/32  brain]
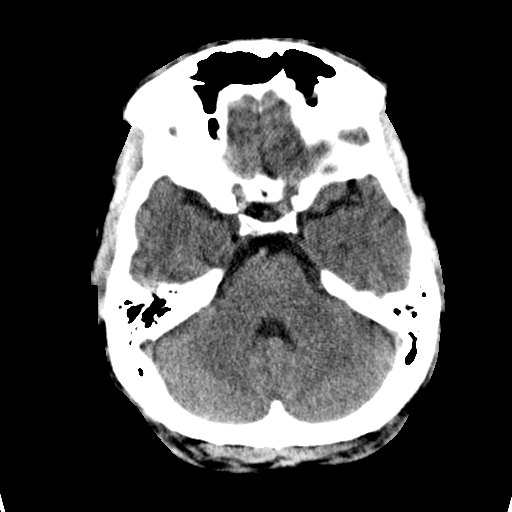
[im 12/32  brain]
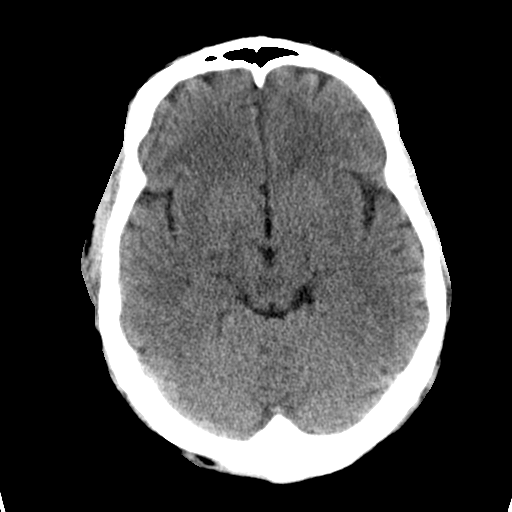
[im 12/32  bone]
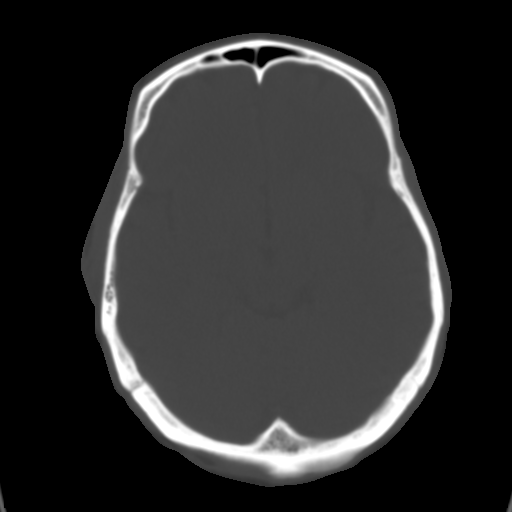
[im 14/32  brain]
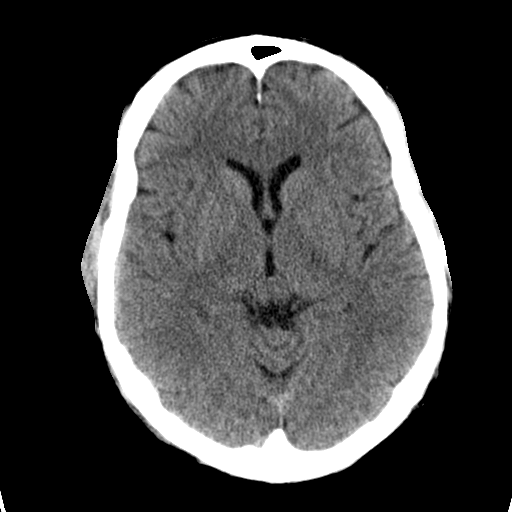
[im 16/32  brain]
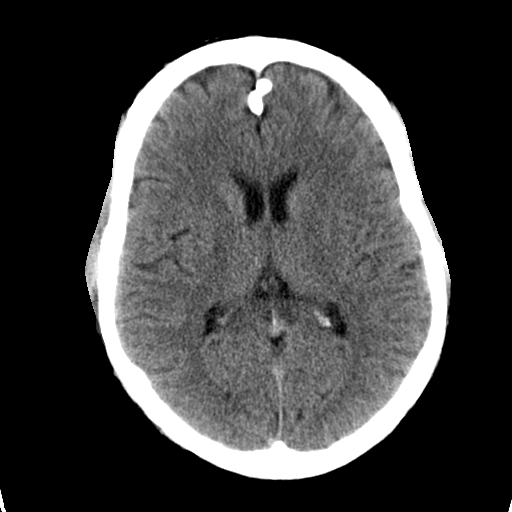
[im 18/32  brain]
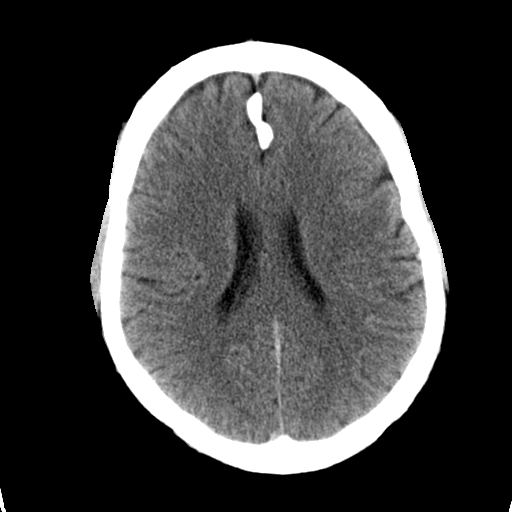
[im 20/32  brain]
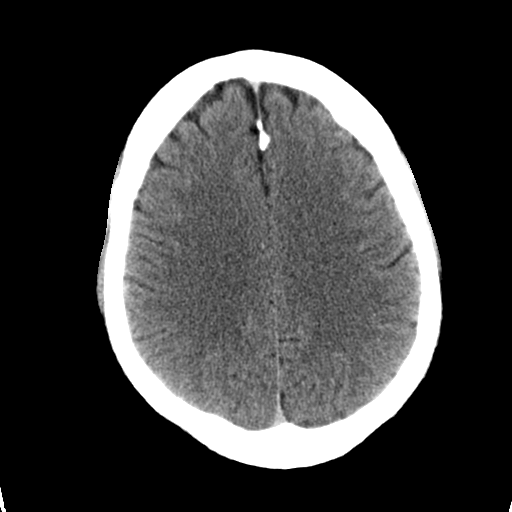
[im 20/32  bone]
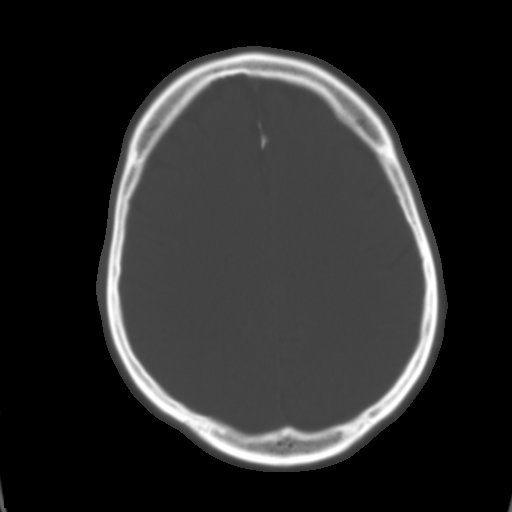
[im 23/32  brain]
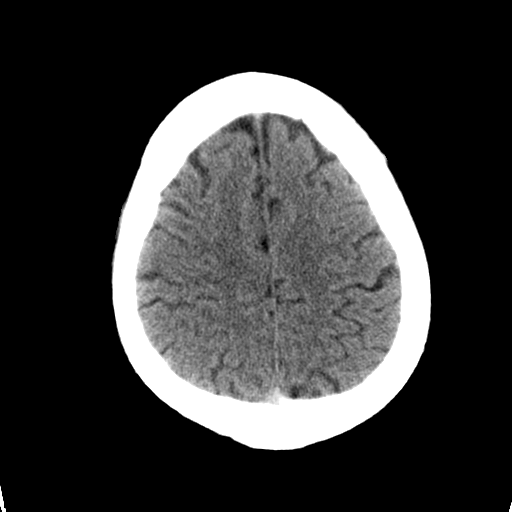
[im 25/32  brain]
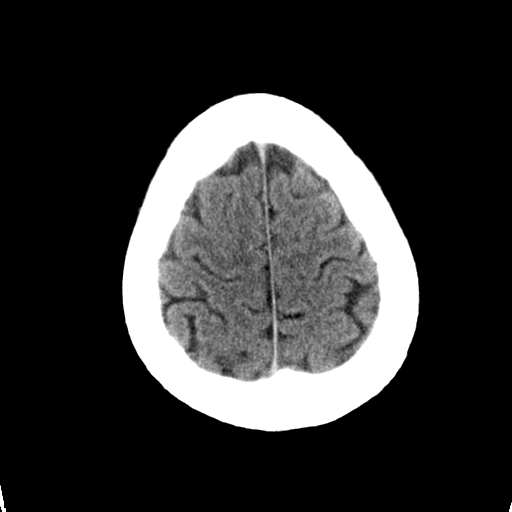
[im 27/32  brain]
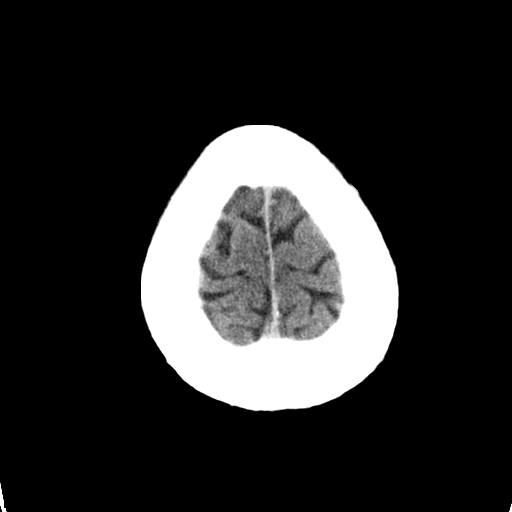
[im 29/32  brain]
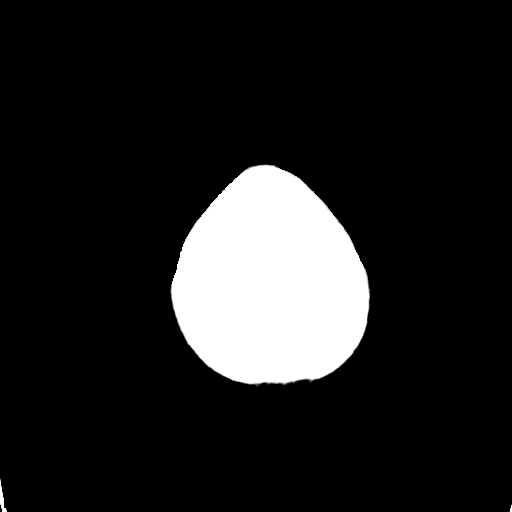
[im 29/32  bone]
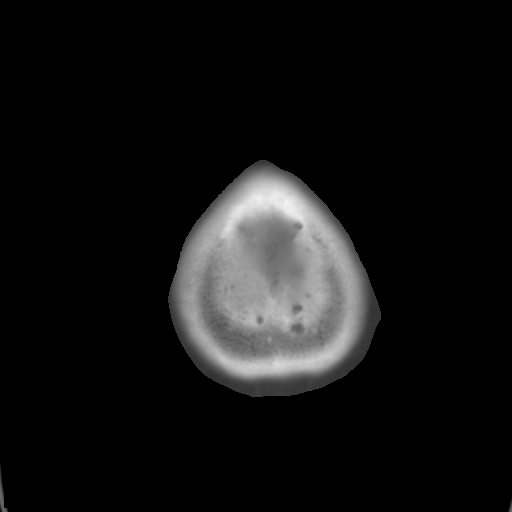

[Series 3: bone · axial · 0.43mm/px · z∈[-188,-143]mm · 3 of 32 slices shown]
[im 3/32  bone]
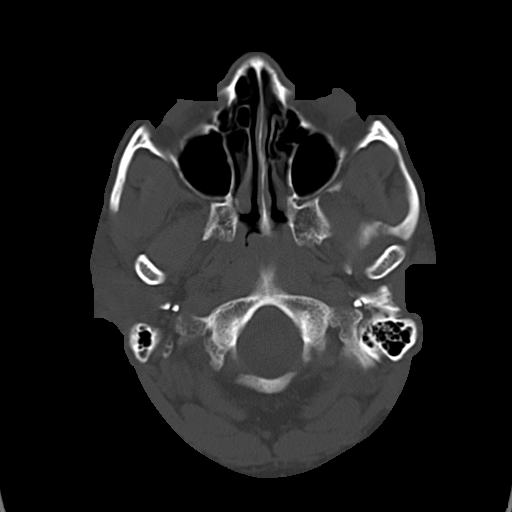
[im 7/32  bone]
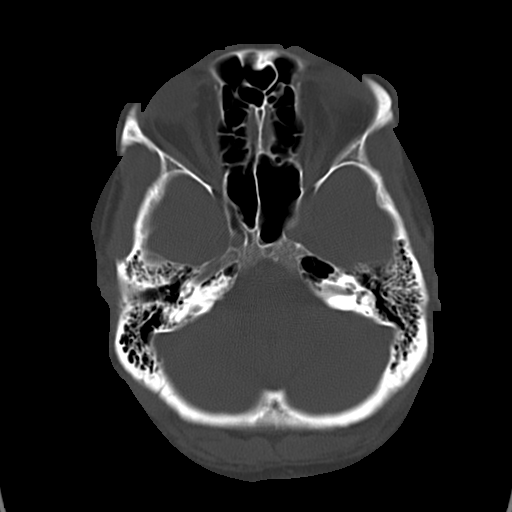
[im 12/32  bone]
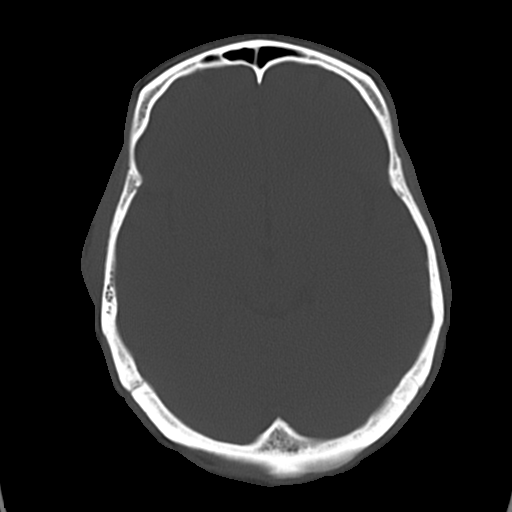

[16 of 30 positions shown; findings below may reference images not displayed]

PROCEDURE AND FINDINGS:  Standard nonenhanced scan obtained. No mass. No
hydrocephalus. No hemorrhage. Mucous retention cyst noted in the right
maxillary sinus. No acute bony abnormality identified.
IMPRESSION: No acute abnormality.

## 2014-03-26 ENCOUNTER — Inpatient Hospital Stay: Payer: Self-pay | Admitting: Psychiatry

## 2014-03-26 LAB — COMPREHENSIVE METABOLIC PANEL
ALBUMIN: 3.6 g/dL (ref 3.4–5.0)
ALK PHOS: 49 U/L (ref 46–116)
ANION GAP: 4 — AB (ref 7–16)
BUN: 14 mg/dL (ref 7–18)
Bilirubin,Total: 0.4 mg/dL (ref 0.2–1.0)
CO2: 29 mmol/L (ref 21–32)
CREATININE: 1.29 mg/dL (ref 0.60–1.30)
Calcium, Total: 8.4 mg/dL — ABNORMAL LOW (ref 8.5–10.1)
Chloride: 106 mmol/L (ref 98–107)
EGFR (African American): >60
EGFR (Non-African Amer.): >60
GLUCOSE: 88 mg/dL (ref 65–99)
OSMOLALITY: 277 (ref 275–301)
Potassium: 4.2 mmol/L (ref 3.5–5.1)
SGOT(AST): 22 U/L (ref 15–37)
SGPT (ALT): 22 U/L (ref 14–63)
Sodium: 139 mmol/L (ref 136–145)
TOTAL PROTEIN: 7 g/dL (ref 6.4–8.2)

## 2014-03-26 LAB — DRUG SCREEN, URINE
AMPHETAMINES, UR SCREEN: NEGATIVE (ref ?–1000)
BARBITURATES, UR SCREEN: NEGATIVE (ref ?–200)
Benzodiazepine, Ur Scrn: POSITIVE (ref ?–200)
Cannabinoid 50 Ng, Ur ~~LOC~~: NEGATIVE (ref ?–50)
Cocaine Metabolite,Ur ~~LOC~~: POSITIVE (ref ?–300)
MDMA (Ecstasy)Ur Screen: NEGATIVE (ref ?–500)
METHADONE, UR SCREEN: NEGATIVE (ref ?–300)
OPIATE, UR SCREEN: NEGATIVE (ref ?–300)
Phencyclidine (PCP) Ur S: NEGATIVE (ref ?–25)
TRICYCLIC, UR SCREEN: NEGATIVE (ref ?–1000)

## 2014-03-26 LAB — URINALYSIS, COMPLETE
BACTERIA: NONE SEEN
Bilirubin,UR: NEGATIVE
Blood: NEGATIVE
Glucose,UR: NEGATIVE mg/dL (ref 0–75)
Ketone: NEGATIVE
Leukocyte Esterase: NEGATIVE
Nitrite: NEGATIVE
Ph: 8 (ref 4.5–8.0)
Protein: 30
RBC, UR: NONE SEEN /HPF (ref 0–5)
SQUAMOUS EPITHELIAL: NONE SEEN
Specific Gravity: 1.024 (ref 1.003–1.030)
WBC UR: NONE SEEN /HPF (ref 0–5)

## 2014-03-26 LAB — ETHANOL: Ethanol: <3 mg/dL

## 2014-03-26 LAB — CBC
HCT: 40.6 % (ref 40.0–52.0)
HGB: 13 g/dL (ref 13.0–18.0)
MCH: 26.4 pg (ref 26.0–34.0)
MCHC: 31.9 g/dL — AB (ref 32.0–36.0)
MCV: 83 fL (ref 80–100)
PLATELETS: 197 10*3/uL (ref 150–440)
RBC: 4.9 10*6/uL (ref 4.40–5.90)
RDW: 14.7 % — AB (ref 11.5–14.5)
WBC: 4.1 10*3/uL (ref 3.8–10.6)

## 2014-03-26 LAB — SALICYLATE LEVEL: Salicylates, Serum: 1.8 mg/dL

## 2014-03-26 LAB — ACETAMINOPHEN LEVEL: Acetaminophen: <2 ug/mL

## 2014-03-29 LAB — VALPROIC ACID LEVEL: Valproic Acid: 65 ug/mL

## 2014-04-24 ENCOUNTER — Inpatient Hospital Stay: Payer: Self-pay | Admitting: Psychiatry

## 2014-05-15 ENCOUNTER — Emergency Department: Payer: Self-pay | Admitting: Emergency Medicine

## 2014-05-20 ENCOUNTER — Emergency Department: Payer: Self-pay | Admitting: Emergency Medicine

## 2014-05-21 LAB — COMPREHENSIVE METABOLIC PANEL
ALBUMIN: 4.4 g/dL
ALT: 16 U/L — AB
AST: 16 U/L
Alkaline Phosphatase: 44 U/L
Anion Gap: 12 (ref 7–16)
BILIRUBIN TOTAL: 0.4 mg/dL
BUN: 10 mg/dL
CREATININE: 1.14 mg/dL
Calcium, Total: 8.4 mg/dL — ABNORMAL LOW
Chloride: 105 mmol/L
Co2: 23 mmol/L
EGFR (African American): >60
EGFR (Non-African Amer.): >60
Glucose: 88 mg/dL
POTASSIUM: 3.6 mmol/L
SODIUM: 140 mmol/L
TOTAL PROTEIN: 7.6 g/dL

## 2014-05-21 LAB — URINALYSIS, COMPLETE
Bacteria: NONE SEEN
Bilirubin,UR: NEGATIVE
Blood: NEGATIVE
Glucose,UR: NEGATIVE mg/dL (ref 0–75)
Leukocyte Esterase: NEGATIVE
NITRITE: NEGATIVE
PH: 5 (ref 4.5–8.0)
Protein: NEGATIVE
RBC,UR: NONE SEEN /HPF (ref 0–5)
SPECIFIC GRAVITY: 1.016 (ref 1.003–1.030)
Squamous Epithelial: <1
WBC UR: NONE SEEN /HPF (ref 0–5)

## 2014-05-21 LAB — DRUG SCREEN, URINE
AMPHETAMINES, UR SCREEN: NEGATIVE
BENZODIAZEPINE, UR SCRN: POSITIVE
Barbiturates, Ur Screen: NEGATIVE
CANNABINOID 50 NG, UR ~~LOC~~: NEGATIVE
Cocaine Metabolite,Ur ~~LOC~~: POSITIVE
MDMA (Ecstasy)Ur Screen: NEGATIVE
Methadone, Ur Screen: NEGATIVE
Opiate, Ur Screen: NEGATIVE
Phencyclidine (PCP) Ur S: NEGATIVE
Tricyclic, Ur Screen: NEGATIVE

## 2014-05-21 LAB — SALICYLATE LEVEL

## 2014-05-21 LAB — ETHANOL: ETHANOL LVL: 104 mg/dL

## 2014-05-21 LAB — CBC
HCT: 38.2 % — ABNORMAL LOW (ref 40.0–52.0)
HGB: 12.3 g/dL — ABNORMAL LOW (ref 13.0–18.0)
MCH: 26.5 pg (ref 26.0–34.0)
MCHC: 32.2 g/dL (ref 32.0–36.0)
MCV: 82 fL (ref 80–100)
PLATELETS: 209 10*3/uL (ref 150–440)
RBC: 4.64 10*6/uL (ref 4.40–5.90)
RDW: 14.6 % — ABNORMAL HIGH (ref 11.5–14.5)
WBC: 7.3 10*3/uL (ref 3.8–10.6)

## 2014-05-21 LAB — ACETAMINOPHEN LEVEL: Acetaminophen: <10 ug/mL

## 2014-06-12 NOTE — Consult Note (Signed)
Brief Consult Note: Diagnosis: Substance induced mood disorder, Alcohol dependence, cocaine dependence.   Patient was seen by consultant.   Consult note dictated.   Recommend further assessment or treatment.   Orders entered.   Comments: Mr. Marti has a h/o substance use and depression. Last night while drunk he called [phone] threatening to cut his wrists and overdosed on his medications. He is no longer suicidal and adamantly declines participation in a rehab program which he needs badly. 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  PLAN: 1. He is on IVC.   2. He was started on a CIWEA protocol.  3. We will continue all his medications as prescribed at discharge 2 weeks ago.   4. We recommend residential treatment at [facility]. The patient declines.  5. We spoke with the wife who supports the plan.  Electronic Signatures: Orson Slick (MD)  (Signed [phone] 09:47)  Authored: Brief Consult Note   Last Updated: 24-Nov-13 09:47 by Orson Slick (MD)

## 2014-06-12 NOTE — H&P (Signed)
PATIENT NAME:  Shaun Colon, SMOAK MR#:  [id number] DATE OF BIRTH:  1970-10-06  DATE OF ADMISSION:  02/05/2012  IDENTIFYING INFORMATION: This is a 44 year old man who presented to the Emergency Room after calling 911 telling them he wanted to die.   CHIEF COMPLAINT: "I messed up."   HISTORY OF PRESENT ILLNESS: Information is obtained from the patient and from the chart. He reports that he was just released from the [facility] [place] in [place] two days ago. He came home to his wife and daughter and within a day his friends found him and brought crack cocaine around. The patient smoked crack cocaine and felt so guilty and upset that when he went back home he drank a bottle of rubbing alcohol. He is not able to give me any really logical rationale for that. He says that perhaps he was hoping that he would die, although on the other hand he knows that it would not have killed him. He also apparently took 15 Neurontin tablets. He also tells me now that he is not sure that he actually wanted to die. After doing these things, he called the police and called [phone] and had has been cooperative with coming into the hospital. He is feeling very guilty and depressed and helpless. This is just since his relapse. Prior to using the cocaine and coming home from the [facility], his mood had been good. He had felt optimistic and upbeat. The patient has not had any of his prescription drugs today. It is not clear whether he has taken any of them since coming home from the [facility].   PAST PSYCHIATRIC HISTORY: The patient was recently at [facility] for substance abuse treatment and treatment of depression. It was from our facility that he was referred to the [facility] facility. He reports that he has had  psychiatric treatment in the past as well and has had several inpatient hospitalizations in the past. He has been tried  on several different antidepressant medicines. It is not clear which medications have been most helpful, although he thinks the combination that he had been taking at [facility] was working fairly well. He has a history of alcohol dependence going back years. He has been able to maintain sobriety for months at a time in the past. He is currently very frustrated at himself for his relapse. He does have a history of overdosing in the past.   SOCIAL HISTORY: The patient lives with his wife and 34 year old daughter. He is currently not employed. 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 He is feeling very guilty and upset because of his lack of financial input to the family. He does have family in the area and states that they continue to be supportive of him.   PAST MEDICAL HISTORY: The patient has no known ongoing significant medical problems.   CURRENT MEDICATIONS: At discharge from [facility] he was taking Seroquel 200 mg at night, Neurontin 300 mg 3 times a day, Prozac 40 mg per day, Ambien 10 mg at night and Tegretol 200 mg twice a day.   FAMILY HISTORY: He does not report any family history of mental health problems.   REVIEW OF SYSTEMS: He is currently feeling depressed and tired, very guilty. Passive suicidal thoughts. Denies having auditory or visual hallucinations. Denies any homicidal ideation. Denies any actual intention to try and hurt himself.   MENTAL STATUS EXAM: The  patient is a neatly groomed man who looks his stated age or younger. Eye contact is poor. Psychomotor activity is very withdrawn. He puts his head down in his hands for much of our interview. Voice is soft and decreased in total production. Affect is depressed and dysphoric. Mood is stated as being terrible. Thoughts are lucid with no evidence of loosening of associations. Denies auditory or visual hallucinations. Passive suicidal thoughts with no intent to act on it. No homicidal ideation. Recent judgment and insight somewhat  impaired. Alert and oriented x 4. Baseline intelligence normal.   PHYSICAL EXAMINATION:  GENERAL: Healthy-appearing man in no acute physical distress.   SKIN: No skin lesions identified.   HEENT: Pupils are equal and reactive. Face symmetric.   NECK AND BACK: Nontender.   MUSCULOSKELETAL: Full range of motion at all extremities. Normal gait. Strength and reflexes normal and symmetric throughout.   NEUROLOGICAL: Cranial nerves symmetric.   LUNGS: Clear with no wheezes.   HEART: Regular rate and rhythm.   ABDOMEN: Soft, nontender, normal bowel sounds.   VITAL SIGNS: Temperature 97, pulse 90, respirations 20, blood pressure 119/83.   LABORATORY RESULTS: Tests done in the Emergency Room show a drug screen positive for cocaine. Glucose is somewhat elevated at 114. Creatinine is elevated at 1.44. Potassium is low at 3.4. Alcohol not detected. TSH normal at 1.3.  Blood osmolality 323.0.  Acetone negative.   ASSESSMENT: The patient is a 44 year old man with a history of recurrent depression and substance abuse, primarily cocaine and alcohol. Currently very depressed acutely after relapse on cocaine, passively suicidal, very distraught. He needs hospitalization because of suicidality and dangerousness to self.   TREATMENT PLAN: Review laboratory studies and physical exam. Supportive therapy and encouragement done with the patient. Education about the process of substance abuse treatment. I discussed his medication. It appears appropriate to go ahead and restart all of his medicines the way he was taking them. Include him in groups and activities on the unit. We will recheck his chemistry panel to make sure everything is going back to normal. The patient is already stating that he does not want to return to the [facility]. We will proceed through detox and stabilization and then work on appropriate local treatment.   DIAGNOSIS, PRINCIPAL AND PRIMARY:  AXIS I:  Depression, not otherwise specified.   SECONDARY DIAGNOSES:  AXIS I:  1. Cocaine dependence.  2. Alcohol dependence.   AXIS II: Borderline and histrionic traits.   AXIS III: Status post ingestion of isopropyl alcohol, probably without long-term sequela.   AXIS IV: Acutely severe from his relapse.     AXIS V: Functioning at time of evaluation 30.   ____________________________ Gonzella Lex, MD jtc:[id number] D: 02/05/2012 22:35:08 ET T: 02/06/2012 08:03:54 ET JOB#: [id number]  cc: Gonzella Lex, MD, <Dictator> Gonzella Lex MD ELECTRONICALLY SIGNED 02/08/2012 10:00

## 2014-06-12 NOTE — H&P (Signed)
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 The patient comes in for readmission to [facility] after being discharged on 06/10/2011 stating chief complaint "I got depressed. I overdosed on pills. I didn't want to live. I had suicidal thoughts".   HISTORY OF PRESENT ILLNESS: When the patient was asked when he last felt well, he reported two months earlier. 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 He started drinking beer at a rate of three 40 ounces per day and along with it he took a handful of Neurontin that he was prescribed from Dr. Kasandra Knudsen at [facility]. His mother and one sister came to know about it as he was going to take more pills from the bottle and they called for help. They called the police and they brought him here for admission.   PAST PSYCHIATRIC HISTORY: The patient has had several inpatient hospitalizations on Psychiatry for similar complaints and was treated with many antidepressants in the past. He was admitted mostly for detox and suicidal thoughts. He reports that he overdosed on pills once before when he was very depressed. He reports that he has been on various antidepressants out of which Celexa probably helped him a little better than others. He is being followed by Dr. Kasandra Knudsen at [facility] in [place], [place]. Last appointment was in August of 2013; next appointment is to be made as Dr. Lilla Shook office called and canceled.   The patient was discharged on the following medications in April 2013: 1. Prozac 40 mg daily. 2. Seroquel 200 mg at bedtime. 3. Vistaril 50 mg p.o. q.6 hours p.r.n. for  anxiety. 4. Benadryl 50 mg at bedtime for sleep.  The patient reports that he is not taking Vistaril or Benadryl as these were discontinued and instead he is prescribed Neurontin 300 mg p.o. t.i.d. and Ambien 10 mg p.o. at bedtime. The patient reports that he has chronic pain in his legs for which Dr. Kasandra Knudsen prescribed him Neurontin that helps.   FAMILY HISTORY OF MENTAL ILLNESS: Mother has depression. No known history of suicides in the family.   FAMILY HISTORY: Raised by parents. Father works for [employer]. Father is living and is 37 years old. Mother stays home. Mother is living and 42 years old. Has one brother and one sister. Close to family.  PERSONAL HISTORY: Born in [place]. Moved to [place] as a baby. Graduated from high school. HISTORY: First job was working for a man who was doing  business at age 56 years. This job lasted for less than a year. 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   MILITARY HISTORY: None.   MARRIAGES: Married once. Married for 20 years. Still married. She works as a Network engineer. They have a daughter who is 59 years old.   ALCOHOL AND DRUGS: First drink of alcohol age 48 years, became a problem immediately. Whenever he's stressed out he starts drinking alcohol and recently he had been getting drunk. No history of DWI's. Never arrested for public drunkenness. Denies any DTs, tremors, seizures, or blackouts. Has been to [facility] maybe four  or five times. Longest period he has been sober after release from [facility] was six months. Denies using THC. Does admit smoking crack cocaine on several occasions recently. No history of IV drugs. Denies smoking nicotine cigarettes.   MEDICAL HISTORY: No known history of high blood pressure. No known diabetes mellitus. No major surgeries. No major injuries. No history of motor vehicle accident. Never been unconscious.   ALLERGIES: Allergic to trazodone as it causes erection which is persistent.   PCP: Not being followed by any  medical doctor. Goes to the Emergency Room as needed.   PHYSICAL EXAMINATION:  VITAL SIGNS: Temperature 96.4, pulse 60 per minute and regular, respirations 18 per minute and regular, blood pressure 130/80 mmHg.   HEENT: Head is normocephalic and atraumatic. Pupils equal, round, and reactive to light and accommodation. Fundi bilaterally benign. EOMS visualized. Tympanic membranes visualized. No exudate.  NECK: Supple without any organomegaly, lymphadenopathy, or thyromegaly.   CHEST: Normal expansion. Normal breath sounds.   HEART: Normal S1 without any murmurs or gallops.  ABDOMEN: Soft. No organomegaly. Normal bowel sounds.  RECTAL: Deferred.   SKIN: Normal turgor. No rash. Warm and dry. Intact.   NEUROLOGIC: Gait is normal. Romberg is negative. Cranial nerves II through XII grossly intact. Deep tendon reflexes 2+. Plantars normal response.   MENTAL STATUS EXAMINATION: The patient is dressed in street clothes. Alert. He knew that he was at [facility]. He repeatedly stated it was October and said it was the 3rd and then the 10th which was wrong. Affect is appropriate with his mood which is low and down and depressed about his life in general and not getting any work to do. Admits that sometimes he feels worthless and useless about himself because of not being able to provide for the family. Denies any ideas or plans to hurt himself or others as he feels safe here and he feels that he is going to be helped. Admits that sometimes he feels disturbed Denies any auditory hallucinations. Denies hearing voices. Admits that he feels paranoid and suspicious and sometimes he feels like somebody is behind him. He admits that he feels there is a person behind him but he does not talk to him and does not say things. Cognition is below average. He could not tell the date and repeatedly stated it was October. He could count money. General knowledge and information is fair for level of education.  Memory is intact. Judgment and insight about substance abuse is rather guarded or poor because he abuses substances without having much knowledge about them. Insight and judgment guarded.   IMPRESSION: AXIS I: 1. Major depressive disorder, recurrent, severe with suicidal ideas. 2. Alcohol dependence, chronic, continuous. 3. Crack cocaine abuse.  AXIS II: Deferred.   AXIS III: Alcohol withdrawal.   AXIS IV: Moderate. 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  AXIS V: GAF 30.  PLAN: The patient is admitted to [facility] for close observation, evaluation, and help. He will be started on CIWA protocol and started on the rest of the medications to help him with his mood and depression. During the stay in the hospital, he will be given milieu therapy and supportive counseling. He will take part in individual and group therapy where substance abuse problems will be addressed and coping skills will be addressed. At the time of discharge he will not have any withdrawal symptoms and his mood will be stabilized and appropriate follow-up appointments in  the community along with follow-up for substance abuse will be done.  ____________________________ Wallace Cullens. Franchot Mimes, MD skc:drc D: 01/03/2012 17:27:40 ET T: 01/04/2012 05:47:58 ET JOB#: [id number]  cc: Arlyn Leak K. Franchot Mimes, MD, <Dictator> Dewain Penning MD ELECTRONICALLY SIGNED 01/09/2012 18:12

## 2014-06-12 NOTE — Consult Note (Signed)
Brief Consult Note: Diagnosis: depression and alcohol dep.   Recommend further assessment or treatment.   Orders entered.   Comments: Psychiatry: Chart reviewed and discussde with intake nurse. Patient suicidal. Under IVC. Needs admission for safety. Orders done for inpt admission.  Electronic Signatures: Lua Feng, Madie Reno (MD)  (Signed 13-Dec-13 13:56)  Authored: Brief Consult Note   Last Updated: 13-Dec-13 13:56 by Gonzella Lex (MD)

## 2014-06-12 NOTE — Consult Note (Signed)
PATIENT NAME:  Shaun Colon, Shaun Colon [id number] OF BIRTH:  1970/10/10 OF ADMISSION:  11/23/2013OF CONSULTATION:  01/17/2012 PHYSICIAN: Lenise Arena, MDPHYSICIAN: Orson Slick, MD FOR CONSULTATION: To evaluate a patient after a suicide attempt.  INFORMATION: Shaun Colon is a 44 year old male with a history of depression and substance abuse.   OF PRESENT ILLNESS: Shaun Colon was discharged from [facility] ten days ago. He was hospitalized after drug overdose and alcohol detox. He has not been able to maintain sobriety at all and quickly relapsed on alcohol and cocaine. Per his wifes report he has been drinking at least two 40 oz beers and uses cocaine daily. He has been severely depressed in spite good medication compliance. He called 911 while drunk and high on cocaine threatening a suicide. He was brought to the ER after overdose on medications.    PSYCHIATRIC HISTORY: The patient has had several inpatient hospitalizations for depression and alcohol detox. He had at least twice suicide attempts by overdose before. He was admitted to [place] for 28 days in March 2013. He has been tried on multiple medications. He is being followed by Dr. Kasandra Knudsen at [facility]  PSYCHIATRIC HISTORY: Mother has depression. No known history of suicides in the family.  MEDICAL HISTORY: None. Trazodone. MEDICAL HISTORY:  1. Prozac 40 mg daily. 2. Seroquel 200 mg at bedtime. Tegretol 200 mg twice daily.  Neurontin 300 mg three times daily. Ambien 10 mg at night. HISTORY: He was born in [place]. He moved to [place] as a baby. He graduated from high school. He owns a grass cutting business. He has been married for 20 years. He has a 89 year old daughter. He has been drinking since his teen age. He started using cocaine several years ago.   REVIEW OF SYSTEMS: CONSTITUTIONAL: No fevers or chills. No weight changes. EYES: No double or blurred vision. ENT: No hearing loss. RESPIRATORY: No shortness of breath or cough. CARDIOVASCULAR: No chest  pain or orthopnea. GASTROINTESTINAL: No abdominal pain, nausea, vomiting, or diarrhea. GU: No incontinence or frequency. ENDOCRINE: No heat or cold intolerance. LYMPHATIC: No anemia or easy bruising. INTEGUMENTARY: No acne or rash. MUSCULOSKELETAL: No muscle or joint pain. NEUROLOGIC: No tingling or weakness. PSYCHIATRIC: See history of present illness for details.  EXAMINATION: VITAL SIGNS: Blood pressure 121/93, pulse 104, respirations 12, temperature 96.2. GENERAL: This is a well-developed slender male in no acute distress. The rest of the physical examination is deferred to his primary provider. DIAGNOSTIC AND RADIOLOGICAL DATA: Chemistries are within normal limits except for glucose of 111. Blood alcohol level 0.157. LFTs are within normal limits. TSH 0.573. Tegretol level 4.9. Urine tox screen is positive for cocaine. CBC is within normal limits. Urinalysis is not suggestive of urinary tract infection.  STATUS EXAMINATION: The patient is examined in the Emergency Room. He is alert and oriented to person, place, time and situation. He is pleasant, polite, and cooperative. He maintains limited eye contact. He does not feel well physically. He is in bed wearing hospital scrubs. Mood is "sad" with flat affect. Thought processing is logical, but there is poverty of thought. Thought content: He denies thoughts of hurting himself or others but was brought to the hospital after a suicide attempt by overdose. There are no delusions or paranoia. He denies auditory hallucinations. His cognition is grossly intact. His insight and judgment are questionable.  RISK ASSESSMENT: This is a patient with a long history of depression and substance abuse who overdosed on medications while intoxicated.   I:  Mood  disorder, not otherwise specified.  Alcohol dependence.  Cocaine dependence.   AXIS II:  Deferred. III:  None. IV:  Mental illness, family conflict, substance abuse. V:  GAF 25.  1. The patient is on IVC.  He was  started on a CIWA protocol. We will continue all his medications as prescribed at discharge 2 weeks ago.  The patient was referred to [facility] residential substance abuse treatment program.   5. We spoke with the wife who supports the plan.  Electronic Signatures: Orson Slick (MD)  (Signed on 25-Nov-13 01:59)  Authored  Last Updated: 25-Nov-13 01:59 by Orson Slick (MD)

## 2014-06-12 NOTE — Discharge Summary (Signed)
PATIENT NAME:  Shaun Colon, Shaun Colon MR#:  [id number] DATE OF BIRTH:  01/06/1971  DATE OF ADMISSION:  02/05/2012 DATE OF DISCHARGE:  02/09/2012  HOSPITAL COURSE: See dictated history and physical for details of admission. Shaun Colon is a 44 year old man with a history of substance who was admitted back into the hospital because of an immediate relapse into drug abuse, particularly cocaine, right after leaving [facility] in [place]. After having a binge on cocaine, the patient became very suicidal and depressed. In the hospital, the patient was initially withdrawn and expressing suicidal ideation, but he did not display any suicidal behavior and was cooperative with treatment. His mood improved during his hospital stay. He was put on his medication that had been used for depression and mood stabilization in the past, which he tolerated well. He participated in groups appropriately. The patient received extensive individual and group counseling about substance abuse treatment. By the time of discharge, he was no longer expressing any suicidal ideation. His affect was improved, and his insight was improved. He was discharged to follow up with [place] in the community, and to live at home with his wife, on the same medication he had taken previously.   DISCHARGE MEDICATIONS: Neurontin 300 mg 3 times a day, Tegretol 200 mg twice a day, Prozac 40 mg per day and Ambien 10 mg at night.   LABORATORY RESULTS: Admission labs showed a drug screen positive for cocaine. Chemistry panel showed a glucose elevated at 114, creatinine elevated at 1.4, potassium low at 3.4 chloride elevated at 108. Alcohol undetectable. TSH normal at 1.3. The CBC was normal. The urinalysis was unremarkable. Acetaminophen and salicylates undetected. Blood gases showed a low pH at 7.32, pCO2 slightly high at 54, HC03 slightly high at 27.8, all on room air. EKG normal.   MENTAL STATUS EXAM AT DISCHARGE: Neatly dressed and  groomed man who looks his stated age. Cooperative with the interview. Good eye contact. Normal psychomotor activity. Speech normal rate, tone, and volume. Affect euthymic, reactive, and appropriate. Mood stated as being okay. Thoughts are lucid without any loosening of associations. Denies auditory or visual hallucinations. Denies suicidal or homicidal ideation. Shows improved insight and judgment. Normal baseline intelligence. Alert and oriented x4, improved motivation, improved self-esteem.   DIAGNOSIS, PRINCIPAL AND PRIMARY:  AXIS I: Cocaine dependence, recent relapse.   SECONDARY DIAGNOSES: AXIS I: Depression, not otherwise specified, rule out bipolar disorder type 2.   AXIS II: Deferred.   AXIS III: Deferred.   AXIS IV: Moderate, stress from ongoing financial problems at home.   AXIS V: Functioning at time of discharge 54.   ____________________________ Gonzella Lex, MD jtc:cb D: 02/23/2012 11:14:56 ET T: 02/23/2012 11:27:02 ET JOB#: [id number]  cc: Gonzella Lex, MD, <Dictator> Gonzella Lex MD ELECTRONICALLY SIGNED 02/23/2012 12:01

## 2014-06-15 NOTE — Discharge Summary (Signed)
PATIENT NAME:  Shaun Colon, TREBILCOCK MR#:  [id number] DATE OF BIRTH:  04-28-1970  DATE OF ADMISSION:  09/02/2012 DATE OF DISCHARGE:  09/14/2012  HOSPITAL COURSE: See dictated history and physical for details of admission. A 44 year old man with a history of cocaine dependence and depression came into the hospital voicing suicidal ideation, multiple symptoms of depression in the context of ongoing substance abuse. The patient detoxed and was stabilized and treated with medicine for his depression. This was the medication that he has done best with in the past. He was included in groups and activities on the unit including, especially, substance abuse programming. The patient met with the treatment team and voiced a preference to go to longer-term treatment program. The plan at this point will be for him to be referred to [facility] tomorrow. He will be discharged tomorrow, and his wife will pick him up and take him to [facility] to the appointment for [facility] where he will hopefully be admitted for a longer-term substance abuse treatment. At the time of discharge, he is denying suicidal ideation. His affect is improved. He is expressing more optimism. He is tolerating medication well. He has been educated on the importance of staying involved in treatment to minimize long-term bad outcomes.   DISCHARGE MEDICINES: Tegretol 200 mg b.i.d., Prozac 40 mg per day, Neurontin 300 mg 3 times a day, Vistaril 25 mg b.i.d., Ambien 10 mg at night.   MENTAL STATUS EXAM AT DISCHARGE: Neatly dressed and groomed man who looks his stated age, cooperative with the interview. Good eye contact. Normal psychomotor activity. Speech normal rate, tone and volume. Affect euthymic, reactive, appropriate. Mood stated as good. Thoughts are lucid with no loosening of associations or delusions. Denies auditory or visual hallucinations. Denies suicidal or homicidal ideation. Shows improved judgment and insight.   LABORATORY RESULTS: Admission labs  showed a drug screen positive for cocaine. TSH normal at 1.23. Alcohol undetected. Chemistry: Slightly elevated creatinine at 1.4. Repeat showed it continued to be somewhat high at 1.36. CBC normal. Urinalysis unremarkable.   DISPOSITION: Discharge in the company of his wife to go to [facility] for intake interview and admission.    DIAGNOSIS, PRINCIPAL AND PRIMARY:  AXIS I: Cocaine dependence.   SECONDARY DIAGNOSES:  AXIS I:   1.  Substance-induced depression.  2.  Major depression, recurrent, moderate.   AXIS II: Deferred.   AXIS III: No diagnosis.   AXIS IV: Severe from ongoing substance abuse and poor functioning.   AXIS V: Functioning at time of discharge is 55.   ____________________________ Gonzella Lex, MD jtc:cb D: 09/13/2012 18:18:00 ET T: 09/13/2012 20:26:39 ET JOB#: [id number]  cc: Gonzella Lex, MD, <Dictator> Gonzella Lex MD ELECTRONICALLY SIGNED 09/15/2012 10:47

## 2014-06-15 NOTE — H&P (Signed)
PATIENT NAME:  Shaun Colon, Shaun Colon MR#:  [id number] DATE OF BIRTH:  [birth year]  DATE OF ADMISSION:  09/02/2012  IDENTIFYING INFORMATION AND CHIEF COMPLAINT: A 44 year old man who came into the Emergency Room after an overdose on Ambien.   CHIEF COMPLAINT: "I guess I just took too many."   HISTORY OF PRESENT ILLNESS: Information obtained from the patient and the chart. He came into the Emergency Room after having taken an overdose of Ambien, possibly as many as 27 of them. He does not remember. He says that he took a couple of them in order to go to sleep and then just started popping more and more of them. On closer questioning, he admits that he was having thoughts about killing himself. His depression has been worse recently. He has been drinking and using cocaine. He had stopped taking his psychiatric medicines some time ago and had stopped being compliant with his outpatient psychiatric treatment. His wife has left him and he is living alone, has sold off his tools of his job, not working, little or no contact. His family shuns him because of their religion and his continued substance abuse and behavior. The patient reports his mood is depressed. He feels tired all the time, does not sleep regularly.  Denies psychotic symptoms. Does endorse passive suicidal thoughts. No homicidal ideation.   PAST PSYCHIATRIC HISTORY: This gentleman has a significant long history with worsening recently of his cocaine and alcohol abuse. He has been referred to multiple detox and rehab facilities, but has struggled to maintain sobriety. In the last 7 months, he reports that he has only been sober for maybe a total of a month. He does have a history of cutting and a history of other suicidal behavior including overdoses. He has been treated with a combination of Tegretol and Neurontin in the past, which he feels like was helpful for his mood when he was taking at, although I am not sure if he has ever really stayed on it long  term when he was not drinking.   SOCIAL HISTORY: The patient is married, has a 59 year old daughter. Both of them have left him and he has little to no contact currently, although the wife still calls him. The patient is living by himself, but is not working, does not have money to pay the rent.  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 He feels very isolated. He has few friends.   PAST MEDICAL HISTORY: Other than his substance abuse, no significant ongoing medical problems, no high blood pressure, no diabetes, no heart disease.   FAMILY HISTORY: None.   REVIEW OF SYSTEMS: Feeling depressed, feeling guilty, feeling bad about himself. Energy low. He is not shaky, not nauseated, not vomiting. No pulmonary or cardiac complaints. Only minor discomfort where he cut himself and required sutures.   MENTAL STATUS EXAMINATION: Neatly groomed gentleman cooperative with the interview. Eye contact poor. Psychomotor activity slow and limited. Speech quiet but easy to understand. Affect blunted, dysphoric, a little bit tearful. Mood stated as depressed. Thoughts generally lucid. No evidence of delusional thinking or loosening of associations. Denies homicidal ideation. Has had suicidal thoughts, still some passive suicidal ideation. No hallucinations. Judgment and insight recently impaired but improving now that he is sobering up. Intelligence normal. Alert and oriented x 4.   PHYSICAL EXAMINATION:  GENERAL: The patient does not appear to be in any acute distress.  SKIN:  He has sutured cuts on his left wrist; otherwise, no acute skin lesions.  HEENT: Pupils equal and reactive. Face symmetric.  NECK AND BACK: Nontender.  EXTREMITIES:  All full range of motion. Normal gait. Normal strength and reflexes throughout.  NEUROLOGIC:  Cranial nerves symmetric and  normal.  LUNGS: Clear to auscultation.  HEART: Regular rate and rhythm.  ABDOMEN: Soft and nontender, normal bowel sounds.  VITAL SIGNS: Temperature 98.3, pulse 52, respirations 18, blood pressure 149/84.   LABORATORY AND DIAGNOSTICS: Results done when he came into the Emergency Room including a drug screen positive for cocaine. Urinalysis with protein, otherwise does not probably appear  infected. EKG showed sinus bradycardia. TSH normal at 1.23. Alcohol undetected. Chemistry showed glucose elevated at 121, creatinine elevated at 1.41. CBC unremarkable.   ASSESSMENT: A 44 year old man with cocaine and alcohol dependence and depression who presents having made a suicide attempt once again, quite depressed once again, having relapsed into heavy use, hopeless, multiple social stresses on him.  Needs admission for detox and treatment of depression and stabilization.   TREATMENT PLAN: Detox protocol if necessary. Restart Tegretol 200 mg a day, Neurontin 300 mg 3 times a day and Prozac 40 mg a day. We discussed the use of Ambien, especially in light of his current hospitalization and his overdose, but given that he had taken it for a long time before that and would otherwise not be able to able to sleep and had not been abusing it previously, I will continue it for now. The patient will be engaged in groups and activities, including daily therapy and substance abuse therapy.   DIAGNOSIS, PRINCIPAL AND PRIMARY:   AXIS I: Major depression, severe, recurrent.   SECONDARY DIAGNOSES: AXIS I:  1.  Alcohol dependence.  2.  Cocaine dependence.  3.  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  AXIS II: Deferred.  AXIS III: Superficial cuts to wrist, alcohol withdrawal.  AXIS IV: Severe from multiple losses related to substance abuse.  AXIS V: Functioning at time of admission 30.  ____________________________ Gonzella Lex, MD jtc:sb D: 09/02/2012 12:17:56 ET T: 09/02/2012 12:28:42  ET JOB#: [id number]  cc: Gonzella Lex, MD, <Dictator> Gonzella Lex MD ELECTRONICALLY SIGNED 09/02/2012 16:44

## 2014-06-15 NOTE — Consult Note (Signed)
PATIENT NAME:  Shaun Colon, Shaun Colon MR#:  [id number] DATE OF BIRTH:  1970-08-04  DATE OF CONSULTATION:  09/01/2012  CONSULTING PHYSICIAN:  Izik Bingman S. Gretel Acre, MD  REASON FOR CONSULTATION: "I do not want to live anymore."   REQUESTING PHYSICIAN:  Marjean Donna.   HISTORY OF PRESENT ILLNESS: The patient is a 44 year old single male who presented to the Emergency Department after he overdosed on Ambien this morning and had lacerations to his left wrist in a suicide attempt. The patient reported that he was trying to kill himself and initially he tried to cut his wrist yesterday with a knife and since that did not work, he overdosed on his sleeping medication. He took a handful of Ambien as well as the Neurontin. He reported that he was feeling very depressed and feels that life is too hard. He reported that he has been separated from his wife for the past few months and it has not been working well. He was unable to keep his job and has not been working for the past 4 months. He has been feeling depressed for years. The patient reported that he feels down, hopeless, helpless and has been having anhedonia. He started drinking alcohol for the past 2 weeks and was consuming four 40 ounces beers. His last use was 2 days ago. He also mentioned about using cocaine sporadically and his last use was 2 days ago. The patient reported that his depressive symptoms have been getting worse and then he decided to hurt himself.   During the interview, the patient maintained poor eye contact and was unable to contract for safety. He reported that he was prescribed medications but he remains noncompliant with the medication and was not taking them as regularly. He was recently started on Prozac, but he has not been taking the medications as prescribed. The patient is willing to get admitted to get treatment for his depressive symptoms.   PAST PSYCHIATRIC HISTORY: The patient reported that he has been diagnosed with depression many  years ago. He has been admitted to [facility] in the past as well as  to [facility] in the past. Reported that he has attempted suicide at least 3 times in the past by overdosing on the medications. He stated that he feels chronically depressed.   FAMILY HISTORY: The patient reported history of depression in the mother.   SOCIAL HISTORY: The patient reported that he has been separated from his wife as it was not working out well among them. He has been unemployed for the past 4 to 5 months. They stated that he cannot sustain his job at this time.   DRUG USE HISTORY: The patient reported that he relapsed on cocaine too recently and his last use was 2 days ago. He has also started drinking for the past 2 weeks. He denied using other illicit drugs.   ALLERGIES: No known drug allergies.   CURRENT HOME MEDICATIONS: Tegretol 200 mg b.i.d., sulfadine 10 mg at bedtime. Fluoxetine 20 mg p.o. daily.   REVIEW OF SYSTEMS:   CONSTITUTIONAL: Denies any fever or chills. No weight changes.   EYES: No double or blurred vision.   RESPIRATORY: No shortness of breath or cough.   CARDIOVASCULAR: No chest pain or orthopnea.   GASTROINTESTINAL: No abdominal pain, nausea, vomiting, diarrhea.   GENITOURINARY: No incontinence or frequency.   ENDOCRINE: No heat or cold intolerance.   LYMPHATIC: No anemia or easy bruising.   INTEGUMENTARY: No acne or rash.   MUSCULOSKELETAL: No muscle or joint  pain.   NEUROLOGIC: No tingling or weakness.   VITAL SIGNS: Temperature 98.5, pulse 56, respirations 16, blood pressure 170/83.   LABORATORY DATA: Glucose 121, BUN 12, creatinine 1.41, sodium 137, potassium 3.6, chloride 104, bicarbonate 27, anion gap 6, osmolality 275. Blood alcohol level less than 3. Protein 7.9, albumin 4.1, bilirubin 0.4. Alkaline phosphatase 72, AST 17, ALT 15. TSH 1.23. Urine drug screen positive for cocaine. WBC 6.2, hemoglobin 13.7, hematocrit 41.3, MCV 82, RDW 15.4.   MENTAL STATUS EXAMINATION: The  patient is a moderately built male who appeared his stated age. He maintained poor eye contact. His speech was low in tone and volume. Mood was depressed and anxious. Affect was congruent. Thought process is logical. Thought content was nondelusional. He admits to feeling depressed and recently attempted suicide by cutting his wrist and overdosing on the medications. He demonstrated poor insight and judgment and denied having any perceptual disturbances.   DIAGNOSTIC IMPRESSION: AXIS I: 1. 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  2. Alcohol abuse.  3. Cocaine abuse.  AXIS II: None.  AXIS III: None noted.   TREATMENT PLAN: 1. The patient will be monitored in the Emergency Room in this department since there are no beds available at the inpatient behavioral health unit, but he will be admitted to the inpatient once a bed becomes available. He will be started back on his medications including Prozac 20 mg in the morning.  2. He will also be started on lithium carbonate 300 mg p.o. at bedtime. He will be given Vistaril 25 mg b.i.d. for anxiety. He is also be continued on CIWA protocol. The patient monitored closely by the staff and will be admitted downstairs when a bed becomes available.   Thank you for allowing me to participate in the care of this patient.     ____________________________ Cordelia Pen. Gretel Acre, MD usf:rw D: 09/01/2012 15:40:29 ET T: 09/01/2012 15:56:28 ET JOB#: [id number]  cc: Cordelia Pen. Gretel Acre, MD, <Dictator> Jeronimo Norma MD ELECTRONICALLY SIGNED 09/08/2012 15:34

## 2014-06-16 NOTE — H&P (Signed)
PATIENT NAME:  Shaun Colon, Shaun Colon MR#:  [id number] DATE OF BIRTH:  1970/03/26  DATE OF ADMISSION:  12/25/2013  REFERRING PHYSICIAN: Emergency Room MD    ATTENDING PHYSICIAN: Tayla Panozzo B. Bary Leriche, MD    IDENTIFYING DATA: Shaun Colon is a 44 year old male with history of depression, mood instability and substance abuse.   CHIEF COMPLAINT: "I was upset."   HISTORY OF PRESENT ILLNESS:  Shaun Colon was hospitalized at [facility] several times in the past for depression, suicidal attempts, and substance abuse. He was discharged last time at the end of July 2014 and went to [facility] for residential long-term substance abuse treatment.  He maintained full sobriety for 7 months. He then relapsed, but still was attending [place] meetings, stayed in touch with his sponsor and was able to be sober most of the time.  In the past 2 months, he stopped taking his medications prescribed for bipolar disorder, stopped seeing Dr. Loni Muse. at [facility] and stopped going to groups. He stopped attending his [place] meetings.  He became increasingly depressed and irritable. He started using more, although he minimizes his problems.  He relapsed on alcohol, but has not been drinking continuously like in the past and most likely does not require alcohol detoxification.  He has been using cocaine as well.  He is positive for benzodiazepines on admission and he was indeed prescribed clonazepam by his psychiatrist, but he has not seen his psychiatrist in a while.  He reports that things went bad when he allowed his wife and 39 year old daughter move in with him.  They are technically separated, do not talk to each other live separate lives but the wife gets on his nerves.  He wanted his daughter to stay at his place so she can graduate from high school in May.  At this point, he feels so frustrated with his wife's bad influence that he is ready to move out of the house and stay at [facility] until May when he hopes his wife will leave.   They are not formally separated and the patient needs to take steps to start divorce process. Yesterday, he had an argument with his wife and wanted to hurt himself. The wife, however, refused to give him a knife.  He went to his father's house where he had an argument with his father; somehow found a knife and cut his wrist.  He required stitches.  The ambulance was called and the patient was brought to the hospital.  He complains that lately he has a lot of social stressors.  He started working for [employer].  Somehow, his job is in jeopardy.  This is a patient who never held a regular job.  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 It is likely that his father arranged for his hire at [employer] since his father has been employed there for years.  It is not impossible that his temper, noncompliance with medication, or substance use contributed to his problems at work and that the father feels somehow responsible.  The patient worries about losing his job and already called his human resources to do short-term disability.  He reports many symptoms of depression with poor sleep, decreased appetite, anhedonia, feeling of guilt, hopelessness, worthlessness, poor energy and concentration, social isolation, and mood instability.  He is not chronically suicidal but when upset he has difficulties keeping his cool.  He denies psychotic symptoms. He denies symptoms suggestive of bipolar mania. He admits to some drinking, but not  continuously.  He has been abusing benzodiazepines and using cocaine.   PAST PSYCHIATRIC HISTORY: Multiple psychiatric admissions for substance abuse treatment and mood instability. He has at least 1 suicide attempt by medication overdose. He has been in substance abuse treatment both outpatient and long-term rehabilitation. He has been a patient at [facility] lately but for the past 2 months has not been taking medications or seeing his doctor or therapist.  He has been tried on multiple medications in the past  Prozac, Celexa, Seroquel, Tegretol, Depakote, Vistaril.  He dislikes Seroquel as it made him gain weight and Tegretol he feels stopped working.  He felt that he has been most successful using Depakote.   FAMILY PSYCHIATRIC HISTORY: Mother with depression. No completed suicides in the family.   PAST MEDICAL HISTORY: None.   ALLERGIES: TRAZODONE AND WALNUTS.   MEDICATIONS ON ADMISSION: None.   SOCIAL HISTORY: He was born in [place] but all of his life has been living in [place], he graduated from high school.  He has been married for 4 years and has an 90 year old daughter. He now is employed at [employer] and would hate to lose this job. He has been drinking and since early 79s. Recently crack cocaine is a problem.   REVIEW OF SYSTEMS:  CONSTITUTIONAL: No fevers or chills. No weight changes.  EYES: No double or blurred vision.  ENT: No hearing loss.  RESPIRATORY: No shortness of breath or cough.  CARDIOVASCULAR: No chest pain or orthopnea.  GASTROINTESTINAL: No abdominal pain, nausea, vomiting, or diarrhea.  GENITOURINARY: No incontinence or frequency.  ENDOCRINE: No heat or cold intolerance.  LYMPHATIC: No anemia or easy bruising.  INTEGUMENTARY: No acne or rash.  MUSCULOSKELETAL: No muscle or joint pain.  NEUROLOGIC: No tingling or weakness.  PSYCHIATRIC: See history of present illness for details.   PHYSICAL EXAMINATION:  VITAL SIGNS: Blood pressure 111/70, pulse 60 respirations 12.  GENERAL: This is a well-developed, middle-aged male in no acute distress.  HEENT: The pupils are equal, round, and reactive to light. Sclerae are anicteric.  NECK: Supple. No thyromegaly.  LUNGS: Clear to auscultation. No dullness to percussion.  HEART: Regular rhythm and rate. No murmurs, rubs, or gallops.  ABDOMEN: Soft, nontender, nondistended. Positive bowel sounds.  MUSCULOSKELETAL: Normal muscle strength in all extremities.  SKIN: No rashes or bruises.  LYMPHATIC: No cervical adenopathy.   NEUROLOGIC: Cranial nerves II through XII are intact.    LABORATORY DATA: Chemistries are within normal limits except for creatinine of 1.44. Blood alcohol level less than 3. LFTs within normal limits. Urine toxicology screen positive for benzodiazepine and cocaine. CBC within normal limits. Urinalysis is not suggestive of urinary tract infection. Serum acetaminophen and salicylates are low.   MENTAL STATUS EXAMINATION ON ADMISSION: The patient is alert and oriented to person, place, time and situation. He is pleasant, polite and cooperative. He is very well groomed and casually dressed.  He maintains good eye contact. Speech is of normal rhythm, rate and volume. Mood is depressed with flat affect.  Thought process is logical and goal oriented. Thought content: He denies suicidal or homicidal ideation now, but was admitted after a suicide attempt by cutting his wrist requiring stitches. There are no delusions or paranoia. There are no auditory or visual hallucinations. His cognition is grossly intact.  Registration, recall, short and long-term memory are intact.  He is of average intelligence and fund of knowledge. His insight and judgment are limited.   SUICIDE RISK ASSESSMENT ON ADMISSION: This  is a patient with a long history of depression, mood instability and substance abuse who came to the hospital after a suicide attempt by cutting in the context of treatment noncompliance and multiple social stressors.   AXIS I: Bipolar 2 disorder, alcohol use disorder, moderate cocaine use disorder, moderate benzodiazepine use disorder, moderate.  AXIS II: Deferred.  AXIS III: Deferred.   PLAN: The patient was admitted to Long unit for safety, stabilization and medication management. He was initially placed on suicide precautions and was closely monitored for any unsafe behaviors. He underwent full psychiatric and risk assessment. He received pharmacotherapy,  individual and group psychotherapy, substance abuse counseling, and support from therapeutic milieu.  1.  Suicidal ideation: The patient is able to contract for safety. 2.  Mood.  He wants to be restarted on Depakote.  He was placed on Lexapro in the Emergency Room. We will continue that.  3.  Anxiety.  We wills start Vistaril.    4.  Insomnia: We will start Ambien.  5.  Substance abuse. He was placed on CIWA protocol in the Emergency Room, vital signs are stable.  We will monitor for symptoms of withdrawal.  Most likely he does not need alcohol detox.  6.  Substance abuse treatment.  The patient fears that if he enters long-term rehab he will lose his job.  He would prefer to be discharged to [facility] and has been working with his sponsor on it already.   DISPOSITION: To be established.     ____________________________ Wardell Honour. Bary Leriche, MD jbp:DT D: 12/26/2013 13:02:16 ET T: 12/26/2013 13:54:32 ET JOB#: [id number]  cc: Venita Seng B. Bary Leriche, MD, <Dictator> Clovis Fredrickson MD ELECTRONICALLY SIGNED 01/01/2014 6:28

## 2014-06-16 NOTE — Consult Note (Signed)
PATIENT NAME:  Shaun Colon, Shaun Colon MR#:  [id number] DATE OF BIRTH:  June 17, 1970  DATE OF CONSULTATION:  12/25/2013  REFERRING PHYSICIAN:   CONSULTING PHYSICIAN:  Gonzella Lex, MD  IDENTIFYING INFORMATION AND REASON FOR CONSULTATION: A 44 year old man with a history of alcohol abuse, cocaine abuse and depression, comes in the hospital after trying to cut himself on the wrist with suicidal ideation.   CHIEF COMPLAINT: "I cannot stand it anymore."   HISTORY OF PRESENT ILLNESS: Information obtained from the patient and the chart; according to the referral paperwork, he was picked when the police were called to his home probably by his father. They found him cutting his wrist. When they approached him, he asked them if they would kill him and said that he wanted to die. The patient tells me that he has been feeling depressed for several months now, but that the suicidal ideation has only been intense for the last 2 weeks. He cannot sleep for the last week. Appetite is poor. Overwhelmed by negative thoughts about himself; he is unclear about whether he is still taking his psychiatric medicine; sounds like he is using it maybe as a p.r.n. sort of thing. He says he is drinking about a pint of liquor a day. He has been able to hold down his job. Despite that he uses cocaine intermittently but not as much as he drinks. His social situation is very stressful for him. He and his wife are "separated," but live in the same house still.   PAST PSYCHIATRIC HISTORY: The patient has had several hospitalizations in the past, similar situation, history of cocaine and alcohol abuse. He has been referred to rehab treatments in the past. Prozac had seemed to be effective for him in the past; but he says he does not think that it helps anymore, does have a past history of some suicidal gestures.   SUBSTANCE ABUSE HISTORY: Long history of cocaine and alcohol abuse. Thinks he has had seizures and probably DTs in the past, has been  to rehabilitation programs and has been able to stay clean for up to 9 months, but relapsed several months ago, now.   SOCIAL HISTORY: Working at [employer], and still maintaining his job despite his drinking. He and his wife are considering themselves separated but still live in the same home for financial reasons, which is very stressful.   FAMILY HISTORY: None known.   PAST MEDICAL HISTORY: No known significant ongoing medical problems.   CURRENT MEDICATIONS: Last known psychiatric medicines were Prozac 40 mg a day, Tegretol 200 mg twice a day, hydroxyzine 25 mg twice a day p.r.n. for anxiety, and gabapentin 300 mg 3 times a day.   ALLERGIES: TRAZODONE.   REVIEW OF SYSTEMS: Depressed mood, negative thinking; suicidal ideation, denies hallucinations, but describes a sense of derealization, denies homicidal ideation, feels tired, a little shaky, mildly sick to his stomach, but not like he is going to throw up; the rest of the review of systems is negative.   MENTAL STATUS EXAMINATION:  Slightly disheveled gentleman who looks his stated age, passively cooperative with the interview, eye contact poor; psychomotor activity sluggish, speech quiet. He tried to keep a blanket covering his face until I asked him to remove it and then became easier to understand. Affect flat and sad. Mood stated as depressed, thoughts lucid, no obvious delusions or loosening of associations; denies auditory or visual hallucinations, endorses suicidal ideation. No homicidal ideation. He could remember 3/3, objects immediately and 2/3, at  3 minutes, alert and oriented x 4, judgment and insight reasonably intact at the moment.   LABORATORY RESULTS: Alcohol level was negative. Chemistry panel, elevated creatinine 1.44, AST low at 14, CBC all normal; hematology panel all normal; I do not see that a urine specimen or a drug screen have come back yet.   VITAL SIGNS: Current blood pressure is 127/76, respirations 14, pulse 65,  temperature 98.   ASSESSMENT: A 44 year old man with major depression and alcohol abuse, who presents after serious suicidal behavior, needs hospitalization both for alcohol withdrawal and stabilization of mood.   TREATMENT PLAN: Admit to psychiatry. At his request discontinue Prozac and try a different antidepressant, specifically Lexapro, which he is requesting. Alcohol withdrawal orders in place. Suicide, falls, seizure, elopement and close precautions all in place.   DIAGNOSIS, PRINCIPAL AND PRIMARY:   AXIS I: Major depression, severe, recurrent.   SECONDARY DIAGNOSES:  AXIS I: Alcohol abuse, cocaine abuse.   AXIS II: Deferred.    ____________________________ Gonzella Lex, MD jtc:nt D: 12/25/2013 15:37:04 ET T: 12/25/2013 16:29:49 ET JOB#: [id number]  cc: Gonzella Lex, MD, <Dictator> Gonzella Lex MD ELECTRONICALLY SIGNED 12/30/2013 14:50

## 2014-06-17 NOTE — Consult Note (Signed)
Psychological Assessment  Shaun Colon Evaluation: 03-10-11 through 1-17-13Administered: [place] (MMPI-2) for Referral: Shaun Colon was referred for a psychological assessment by his physician, Cephus Shelling, MD.  He was admitted to [facility] for treatment of increasing depression and suicidal ideation. He has a history of alcohol dependence. Please see the history and physical and psychosocial history for further background information. An assessment of personality structure was requested. [name]? MMPI-2 protocol is compared to that of other adult males he obtained the following profile: 1*[id number]?+-62/"#. The MMPI-2 validity scales indicate that the clinical profile is valid. They also suggest that he is experiencing significant difficulties. PresentationHe reports that he is experiencing a moderate level of emotional distress that is characterized by brooding, dysphoria, anger and anhedonia. He frequently worries about something or someone. He is more sensitive and feels more intensely than most people. His feelings are easily hurt and he is inclined to take things hard. He easily becomes impatient with people. He is often irritable and grouchy and generally feels angry with both himself and others. It makes him angry when people hurry him and he gets angry with himself for giving in to others so much. He has become so angry that he does not know what comes over him and he feels as though he will explode.  He reports that he has problems with attention, concentration and memory. He often gets confused and forgets what he wants to say. His judgment is not as good as it was in the past. He certainly is lacking in self-confidence and believes that he is not as good as other people and thinks he is useless at times. He is apt to take disappointments so keenly that he cannot put them out of his mind. He has a hard time making decisions and he feels helpless  when he has to make some important decisions. He has sometimes thought that difficulties were piling up so high that he could not overcome them. He thinks that there is something wrong with his mind and thinks that he is about to go to pieces. Several times a week he thinks something dreadful is about to happen. He is sensitive to any form of criticism, assumes others are against him, and anticipates being rejected. He is sure that he is being talked about and plotted against. He is sure that he has been punished without cause. He often wonders what hidden reason another person may have for doing something nice for him. He has often been misunderstood when he was trying to be helpful. He is not happy with himself the way he is and wishes that he could be as happy as others seem to be. Relations: He reports that he is introverted and wishes he was not so shy. He finds it hard to talk when he meets new people or is in a group of people and spends most of his spare time by himself. Whenever possible he avoids being in a crowd. He feels lonely even when he is with people. He does not seem to make friends as quickly as others do. He reports mild conflict with members of his family.  Problem Areas: He reports a number of physical symptoms. He is not in as good physical health as most of his friends. He has difficulty going to sleep because thoughts or ideas are bothering him, his sleep is fitful and disturbed and he does not wake up fresh and rested most mornings. He has often been frightened in the  middle of the night. He tires quickly. He does not have enough energy to do his work and he is not as able to work as he once was. There is a theme of hopelessness that pervades his thoughts, so suicidal ideation should be monitored on an ongoing basis His prognosis is guarded because his anger and brooding resentment make it very difficult to develop a therapeutic alliance. Short-term, behavioral interventions that focus  on his reasons for entering treatment will be most beneficial. Cognitive-behavioral therapy that focuses on his dysphoric mood and anger also may be appropriate.  Impression:Disorder NOS R/O Bipolar DisorderDisorder NOS with borderline features   Electronic Signatures: Garald Braver (PsyD, HSP-P)  (Signed on 17-Jan-13 15:49)  Authored  Last Updated: 17-Jan-13 15:49 by Garald Braver (PsyD, HSP-P)

## 2014-06-17 NOTE — Consult Note (Signed)
PATIENT NAME:  Shaun Colon, Shaun Colon MR#:  [id number] DATE OF BIRTH:  [birth year]  DATE OF CONSULTATION:  05/26/2011  REFERRING PHYSICIAN:  Beaulah Dinning, MD  CONSULTING PHYSICIAN:  Drue Stager. [place], MD  REASON FOR CONSULTATION: Anxiety with somatic symptoms and substance dependence.   HISTORY OF PRESENT ILLNESS: [name] is a 44 year old male presenting to the Emergency Room today with chest pain. He has ongoing worries and regret about his continued relapse on alcohol. He states that he and his wife both have problems that they need to work on. He acknowledges that there are verbal arguments in the home. He denies any physical abuse or altercations going on in the home. He does reside with his wife. Please see the social history.   [name] does have muscle tension, excessive worry, feeling on edge. He also has had periods of relapse since being discharged from [facility] on 03/13/2011.   He did report to the Emergency Room on March 8th with a relapse of a fifth of liquor per day since discharge on January 18th. At that time he was agreeing to go to a residential treatment facility. He entered an agreement with [facility]. He has not proceeded with the program. He has not been attending 12-step meetings.   He has no thoughts of harming himself or others. He has no hallucinations or delusions. His orientation and memory function are intact. He still has intact interests as well as constructive future goals. He does acknowledge his powerlessness over his substance dependence.   PAST PSYCHIATRIC HISTORY: Mr. Regis has undergone a number of psychiatric as well as residential treatment facility admissions. He has undergone residential treatment at [facility] as well as [facility]. He has undergone admission to the [facility] at [facility].   [name] does have a history of multiple episodes of major depression and has been  treated with Wellbutrin. He also has been on Tegretol in the past. He has been treated with Lexapro 20 mg daily which was not effective. He also was not helped with Prozac 20 mg per day.   He underwent a new psychotropic regimen with Abilify added along with an Ativan detox from alcohol in January in the [facility]. At that time he was discharged with a diagnosis of alcohol dependence and mood disorder, not otherwise specified.  He had discharge medications including: 1. Prozac 40 mg daily.  2. Ambien 10 mg at bedtime.  3. Abilify 2 mg daily.  4. Cogentin 0.5 mg 2 times a day for EPS.   He had shown a good response with this regimen with improved mood. He was to follow-[facility] with [facility] well as [facility]. Please see the above regarding his several week relapse and visit to the ER on March 8th.    FAMILY PSYCHIATRIC HISTORY: Brother with depression and obsessive compulsive disorder. Mother has depression.   SOCIAL HISTORY: [name] has been married for 2 years and has a wife 61 years old. [name] was born in [place] but moved to [place] when he was a young child.   EDUCATION: High school.   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 He had a job at [employer] prior to that. His drinking has been affecting his yard work. He also works for a [employer].   [name] does have a history of drinking to the point of physiologic  dependence and withdrawal symptoms. He has never undergone arrests related to alcohol or other arrests. He has no history of DWI's. He does not have a history of illegal drugs.   PAST MEDICAL HISTORY: Usual childhood illnesses.   ALLERGIES: No known drug allergies.   MEDICATIONS: As those above prescribed from the [facility] in January.   LABORATORY DATA: He is undergoing a troponin series. BUN and creatinine unremarkable. CBC unremarkable. Urine drug screen  positive for cocaine. EKG QTc 409 ms. PACs were mentioned on his EKG.   REVIEW OF SYSTEMS: Constitutional, HEENT, mouth, neurologic, psychiatric, cardiovascular, respiratory, gastrointestinal, genitourinary, skin, musculoskeletal, hematologic, lymphatic, endocrine, metabolic all unremarkable.   PHYSICAL EXAMINATION:   VITAL SIGNS: Temperature 98.1, pulse 52, respiratory rate 18, blood pressure 146/72.   GENERAL APPEARANCE: [name] is a middle-aged male lying in a supine position, well developed, well nourished with no abnormal involuntary movements. He has no cachexia. His muscle tone is normal. Grooming and hygiene are normal.   MENTAL STATUS EXAMINATION: [name] has normal eye contact. He is alert. His concentration is normal. He is oriented to all spheres. His memory is intact for immediate, recent, and remote. His fund of knowledge, intelligence, and use of language are normal. His speech is mildly soft. He has normal prosody. No dysarthria. Thought process is logical, coherent, and goal directed. No looseness of associations or tangents. Thought content no thoughts of harming himself. No thoughts of harming others. No delusions or hallucinations. His affect is mildly flat at baseline but with a broad and appropriate range as the interview continued. Thought content no thoughts of harming himself or others. No delusions or hallucinations. Insight and judgment are intact.   ASSESSMENT:  AXIS I:  1. Alcohol dependence.  2. Cocaine abuse.  3. Major depressive disorder, recurrent, in remission.  4. Rule out generalized anxiety disorder.   AXIS II: Deferred.   AXIS III: Likely anxiety related somatic symptoms, undergoing cardiac rule out MI.   AXIS IV: Marital, primary support.   AXIS V: 55.  [name] is not at risk to harm himself or others. He agrees to call emergency services immediately for any thoughts of harming himself, thoughts of harming others, or distress.   He declines  admission to the inpatient psychiatric unit and he is not committable.   He wants to proceed with his [facility] program. He does express appropriate regret about not participating appropriately with the program.   The 12-step method on a basic level was reinforced with ego supportive fashion with emphasis that a cycle of change can make him relapse more.   He will continue on the psychotropic regimen above for anti-depression.   12-step meetings, 90 meetings in 90 days.   Report to the Emergency Room immediately for any withdrawal symptoms.   Continue with his [facility] psychotropic follow-up appointments.  If he continues with his [facility] program, there can usually be couple involvement as the program proceeds.   ____________________________ Drue Stager. Rasmus Preusser, MD jsw:drc D: 05/28/2011 20:21:40 ET T: 05/29/2011 08:23:41 ET JOB#: [id number]  cc: Drue Stager. Enslee Bibbins, MD, <Dictator> Billie Ruddy MD ELECTRONICALLY SIGNED 06/04/2011 0:05

## 2014-06-17 NOTE — Discharge Summary (Signed)
PATIENT NAME:  Shaun Colon, Shaun Colon MR#:  [id number] DATE OF BIRTH:  1970/07/05  DATE OF ADMISSION:  06/04/2011 DATE OF DISCHARGE:  06/10/2011  HOSPITAL COURSE: Shaun Colon was admitted to the hospital through the Emergency Room where he presented with relapse into substance abuse with heavy drinking and suicidal ideation and depression with a lot of anxiety. In the hospital, he has not reported any active suicidal ideation and has not engaged in any dangerous, aggressive or suicidal behavior. He was put on the standard alcohol withdrawal protocol and has gone through that without complication. His vital signs currently are stable. His use of benzodiazepines has been minimal. The patient has maintained a somewhat dysphoric, down and anxious affect. He continues to endorse depression but denies suicidal ideation. Does not endorse psychotic symptoms. He also endorses chronic anxiety. He has attended groups appropriately, participated well and shown pretty good insight. Medications used have been Prozac which is now at 40 mg a day as well as Seroquel currently at 200 mg at night. He had not been on Seroquel previously but had been on Abilify which had not been very tolerable for him. The Seroquel is thought to possibly help with the depression as well as anxiety and to help with his sleep. Prozac had been used previously but he had not been compliant with it and had not been on a higher dose of it. At this point, the patient has been accepted to the alcohol and drug abuse treatment Center in [place]. He is counseled about the importance of sobriety and follow through treatment to decrease suicidal ideation and risk and to improve his overall functioning. He is agreeable to the treatment plan.   DISCHARGE MEDICATIONS:  1. Fluoxetine 40 mg p.o. daily.  2. Quetiapine 200 mg at bedtime. 3. Vistaril 50 mg q.6 hours p.r.n. for anxiety. 4. Benadryl 50 mg at bedtime p.r.n. sleep.   LABORATORY DATA: Cholesterol 191,  triglycerides 202, HDL 43, VLDL 40, LDL 108. TSH slightly low at 0.39. Salicylates normal. CBC normal with only one slight insignificant abnormality in the RDW. Alcohol on admission was 75. BUN was low at 6, total protein elevated at 8.5. No other significant chemistry abnormalities. Drug screen negative.   MENTAL STATUS EXAM AT DISCHARGE: Neatly dressed and groomed man. Good eye contact. Subdued psychomotor activity. Blunted affect. Speech somewhat quiet but easy to understand. Thoughts are lucid and directed. No evidence of loosening of associations or bizarre or delusional thinking. Denies auditory or visual hallucinations. Denied suicidal or homicidal ideation. He reports no current hallucinations. Insight and judgment improved.   DISPOSITION: He is voluntary in the hospital and will be discharged only with the direct understanding that he is going to be transferred immediately to [facility] to the alcohol and drug abuse treatment program and he is agreeable to this.   DIAGNOSIS PRINCIPLE AND PRIMARY:  AXIS I:  1. Alcohol dependence.  2. Major depression, severe, recurrent.  SECONDARY DIAGNOSES:  AXIS I:  Anxiety disorder, not otherwise specified.   AXIS II: Deferred.   AXIS III: Alcohol withdrawal.   AXIS IV: Moderate. Chronic stress from burden of illness.   AXIS V: Functioning at time of discharge 14.    ____________________________ Gonzella Lex, MD jtc:rbg D: 06/09/2011 12:26:03 ET T: 06/10/2011 10:13:47 ET JOB#: [id number]  cc: Gonzella Lex, MD, <Dictator> Gonzella Lex MD ELECTRONICALLY SIGNED 06/10/2011 12:40

## 2014-06-17 NOTE — H&P (Signed)
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 Patient is married for twenty years and lives with his wife who is 34 years old and their daughter who is 95 years old. All three of them live in a house that has 800 square feet. Patient comes for readmission to [facility] with a chief complaint "Suicidal thoughts. I don't want to live no more. Feeling stressed out." According to information obtained from the Emergency Room patient started having worsened depression and he took six Ativan and started drinking more alcohol and usually he drinks about four beers per day. He had thoughts of not wanting to live anymore and had suicidal thoughts though he had no specific plan and decided to come to [facility] for help.   HISTORY OF PRESENT ILLNESS: When patient was asked when he last felt well he reported "Don't know, quite some time ago." He reports that he has been fighting with depression for many years. He was last discharged from [facility] on 04/16/2010 after being stabilized. He was stabilized on Ambien 10 mg at bedtime, Tegretol 200 mg b.i.d. and Wellbutrin 100 mg in the morning and at noon. Patient reports that he took Tegretol for three months and he quit taking because it made him sleepy when he was driving a car. In addition he quit taking Wellbutrin because it made him stay up all night. He is being followed by Dr. Kasandra Knudsen at [facility] who changed his medications quite a bit. He was on Lexapro generic 20 mg which did not help him. He was last started on Prozac and is currently taking 20 mg p.o. q.a.m. and is not helping him hold his depression. He reports that he has been on various medications for his depression for the past twenty years and the only  medication that he has not been on is lithium.   PAST PSYCHIATRIC HISTORY: Has five inpatient hospitalizations in psychiatry so far. First inpatient hospitalization in psychiatry was at [facility] for seven days for depression and there was no specific reason for the same. Longest period of inpatient hospitalization in psychiatry was for seven days. Last inpatient hospitalization was at [facility] in 03/2010. No history of suicide attempts but did have suicidal thoughts. Being followed by Dr. Kasandra Knudsen at [facility] and last appointment was in December 2012, next appointment is in March 2013.   FAMILY HISTORY OF MENTAL ILLNESS: Brother has obsessive-compulsive disorder and depression. Mother has depression. No known history of suicides in the family.   FAMILY HISTORY: Raised by parents. Father worked for [employer]. Father is living and 35 years old. Mother stays home and mother is living and 64 years old. Has one brother and one sister, close to family.  PERSONAL HISTORY: Born in [place]. Moved to [place] when he was 44 years old. Graduated from high school. No college.   WORK HISTORY: First job at [employer] at age 7 years. This job lasted for seven months and quit for a different job. Longest job that he has ever held was self employed and cuts grass for a living; been cutting grass for 18 years. Not able to cut too many yards because of him getting depressed and sick.   MARRIAGES: Married once. Married for twenty years. Wife works  for a moving company. They have a daughter age 31 years old.  ALCOHOL AND DRUGS: First drink of alcohol at 20 years. Did not become a problem for some time. Has become a problem recently and had been drinking at the rate of four beers a day and recently at the time of admission he has been drinking more than four beers per day because he was fighting depression. No history of DWIs. Never arrested for public drunkenness. Last drink of alcohol was on 03/07/2011. He gets  drunk quite often. Denies street or prescription drug abuse. No history of IV drugs. Denies smoking nicotine cigarettes.   PAST MEDICAL HISTORY: No known history of high blood pressure. No known diabetes mellitus. According to the chart he has elevated hypertension when he was drinking alcohol. No major surgeries. No history of motor vehicle accident. Never been unconscious.   ALLERGIES: No known drug allergies.   PRIMARY CARE PHYSICIAN: Not being followed by any physician at this time and goes to Emergency Room as needed.   PHYSICAL EXAMINATION:  VITAL SIGNS: Temperature 97.8, pulse 84 per minute regular, respirations 20 per minute regular, blood pressure 128/84 mmHg.   HEENT: Head is normocephalic, atraumatic. Eyes: Pupils are equal, round, and reactive to light and accommodation. Fundi bilaterally benign. Extraocular movements are intact. Tympanic membrane visualized, no exudates.   NECK: Supple without jugular venous distention, organomegaly, lymphadenopathy, thyromegaly.   CHEST: Normal expansion. Normal breath sounds heard.  HEART: Normal S1, S2 heard without any murmurs or gallops.   ABDOMEN: Soft without organomegaly. Bowel sounds heard.   RECTAL: Deferred.  NEUROLOGICAL: Gait is normal. Romberg is negative. Cranial nerves II through XII grossly intact and normal. DTRs 2+ and normal. Plantars are normal response.   MENTAL STATUS EXAMINATION: Patient is dressed in street clothes. Alert and oriented to place, person and time. Fully aware of situation that brought him for admission to [facility]. Affect is flat with mood depressed. Admits feeling hopeless and helpless. Admits feeling worthless and useless. Does admit to suicidal wishes and thoughts but no active plans. No evidence of psychosis. Denies auditory or visual hallucinations. Denies hearing voices or seeing things. Thought processes are logical and goal directed. Cognition grossly intact. Recall and memory are good after three  minutes and several minutes. He knew capital of [place], capital of [place] and name of the current president and previous few presidents. Calculations and counting of money is good. He could spell the word world forward and backward without any problem. Does admit to sleep and appetite disturbance and it gets worse when he is depressed. Does admit to suicidal wishes but no plans and contracts for safety. Insight and judgment guarded.   IMPRESSION: AXIS I:  1. Mood disorder, not otherwise specified.  2. Alcohol dependence, chronic, continuous.  3. Substance-induced mood disorder.  AXIS II: Deferred.  AXIS III: None major.  AXIS IV: Long history of depression and substance abuse and no regular job which adds to financial stress.  AXIS V: Global Assessment of Functioning 35 at the time of admission.  PLAN: Patient is admitted to [facility] for close observation, evaluation and help. He will be started back on his Prozac and this time it will be Prozac 40 mg p.o. q.a.m. for better control of his depression. Will be started back on Ambien 10 mg p.o. at bedtime to help him rest. He will be started on CIWA with alcohol detox as he has been drinking alcohol recently and recently increased  the amount. During the stay in the hospital he will be given milieu therapy and supportive counseling. Substance abuse will be addressed and he will learn about alcohol dependence and problems related to the same. At the time of discharge his depression will be under control and appropriate follow-up appointments will be made along with substance abuse counseling.   ____________________________ Wallace Cullens. Franchot Mimes, MD skc:cms D: 03/08/2011 16:16:56 ET T: 03/09/2011 05:31:50 ET JOB#: [id number]  cc: Arlyn Leak K. Franchot Mimes, MD, <Dictator> Dewain Penning MD ELECTRONICALLY SIGNED 03/14/2011 17:27

## 2014-06-17 NOTE — H&P (Signed)
PATIENT NAME:  Shaun Colon, Shaun Colon MR#:  [id number] DATE OF BIRTH:  1970-08-26  DATE OF ADMISSION:  06/04/2011  IDENTIFYING INFORMATION: The patient is a 44 year old man who brought himself into the Emergency Room because of ongoing depression with suicidal ideation and uncontrollable alcohol abuse.   CHIEF COMPLAINT: "I can't stop drinking."   HISTORY OF PRESENT ILLNESS: The patient and his wife were both present and gave information. History also was obtained from the chart. The patient reports that he has chronic depression which has been particularly worse for the last week. He says that he feels down and sad all the time, feels hopeless. Lots of negative thinking. Energy level is low. He has trouble getting to sleep at night. He feels hopeless. He has had some suicidal thoughts with thoughts of overdosing on his medicine. Additionally, he has been using alcohol abusively continuously. He is drinking about four 40-ounce beers on a daily basis, and his last drink was yesterday. He has not been compliant with his antidepressant medicine. He also ran out of his Ativan and Ambien several days ago because of overusing them and so has been without those medicines. He is not doing any work. He feels stressed and down and negative about himself.   PAST PSYCHIATRIC HISTORY: Multiple previous hospitalizations for the same kind of complaint. He has been treated with multiple antidepressants in the past. The patient says that of all of them he thinks that maybe Lexapro was helpful, but he did not stay on it consistently. He has never been able to stay on medicine and stay sober for enough time to make it really clear that he has had an effective trial of medicine. He has had one suicide attempt in the past by overdose about a year ago. He denies any violence. He denies any psychotic symptoms. He has chronic anxiety, particularly social anxiety, that has been identified in the past.   SOCIAL HISTORY: He lives with his  wife and young daughter. 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 He mostly lays around the house doing nothing during the day. He feels guilty about it.   PAST MEDICAL HISTORY: No known ongoing medical problems. No history of heart disease, high blood pressure, diabetes, or any other significant medical problem.   CURRENT MEDICATIONS:  1. Prozac 20 mg a day.  2. Ativan 1 mg per day. 3. Ambien 10 mg at night.   He has not really been compliant with any of them.   ALLERGIES: Trazodone.   SUBSTANCE ABUSE HISTORY: He has many years history of ongoing alcohol dependence. He has been to detox programs several times. He says he has never been to a longer term rehab program of any sort. He does not seem to have ever really followed up with appropriate outpatient substance abuse treatment either. His longest sobriety was two months last year which he said he did entirely on his own. He says that he has used cocaine within the last week or so but did not like it. He has not used it in the last day. He does not have a history identified of abusing other drugs. It does sound like recently he has been abusing his Ativan and Ambien. He does have a history of withdrawal seizures at least one time in the past.   FAMILY HISTORY: At least two first-degree relatives with major depression. No known family  history of suicide.   REVIEW OF SYSTEMS: He complains of depressed mood, fatigue, poor sleep at night, suicidal ideation without active intent or plan. Helplessness, hopelessness. He feels tired. He feels jittery and shaky. No GI complaints. No specific pain complaints.   MENTAL STATUS EXAM: The patient is alert and oriented and cooperative with the exam. He is a neatly groomed man, looks his stated age. Eye contact is intermittent. Psychomotor activity is limited. Mostly he stays curled up in a  fetal position. Speech is very quiet but easy to understand. Thoughts are slow with minimal elaboration, probably from depression. Mood is stated as depressed. Affect is dysphoric and blunted. He denies hallucinations. He does not appear to be responding to internal stimuli. He does not report anything that sounds delusional. He has had suicidal thoughts with thoughts of overdosing on his medicine and feeling hopeless. He does not have any intent to act on it in the hospital. No homicidal ideation. Judgment about substance use has recently been poor, but right now his judgment and insight are improved.   PHYSICAL EXAMINATION:  GENERAL: The patient weighs 175 pounds. A well-built gentleman, does not look to be in any physical distress.   VITAL SIGNS:  Pulse 65, respirations 18, blood pressure 128/79, temperature 97.   SKIN: No skin lesions identified.   HEENT: Pupils are equal and reactive. Eyegrounds bilaterally bloodshot. Mucosa moist. Head symmetric.    NECK: Supple, nontender.   BACK: Nontender.   NEUROLOGICAL: Cranial nerves are all intact and symmetric. Strength and reflexes are intact and symmetric throughout. Gait is within normal limits.   MUSCULOSKELETAL: Full range of motion at all extremities.   LUNGS: Clear to auscultation with no extra sounds.   HEART: Regular rate and rhythm.   ABDOMEN: Soft, nontender, normal bowel sounds.   ASSESSMENT: A 44 year old man with alcohol dependence, and major depression, and possibly an anxiety disorder. His inability to stay sober for any length of time has compromised the possibility of his depression and anxiety getting better. He needs hospitalization because of acute dangerousness of suicide and alcohol withdrawal.   TREATMENT PLAN: Admit to Psychiatry. He is voluntary. Put on the alcohol withdrawal detox protocol. Continue the Prozac 20 mg a day. Try to get further collateral information and form rapport with the patient. Discuss substance  abuse treatment as well as supportive and cognitive treatment. Work on discharge planning with possible referral to [facility].   DIAGNOSIS PRINCIPAL AND PRIMARY:  AXIS I: Major depression, severe, recurrent.   SECONDARY DIAGNOSES:  AXIS I: Alcohol dependence.   AXIS II: Deferred.  AXIS III: No diagnosis.   AXIS IV: Moderate-to-severe, chronic from poor functioning from drinking and unemployment.   AXIS V: Functioning at time of evaluation 30.   ____________________________ Gonzella Lex, MD jtc:cbb D: 06/04/2011 12:59:25 ET T: 06/04/2011 13:16:41 ET JOB#: [id number]  cc: Gonzella Lex, MD, <Dictator> Gonzella Lex MD ELECTRONICALLY SIGNED 06/04/2011 14:16

## 2014-06-24 NOTE — H&P (Signed)
PATIENT NAME:  Shaun Colon, Shaun Colon MR#:  [id number] DATE OF BIRTH:  March 23, 1970  DATE OF ADMISSION:  03/26/2014  DATE OF ASSESSMENT: 03/27/2014.  REFERRING PHYSICIAN: Emergency room MD.   ATTENDING PHYSICIAN: Rumeal Cullipher B. Bary Leriche, MD   IDENTIFYING DATA: Mr. Ebron is a 44 year old male with history of depression, mood instability, and substance abuse.   CHIEF COMPLAINT: "I was thinking of suicide."   HISTORY OF PRESENT ILLNESS: Mr. Colee has a long history of cocaine and alcohol abuse. He has been hospitalized multiple times over the years, mostly for substance use. He has been to substance abuse rehab was detoxed multiple times. He rarely follows up with his outpatient treatment. He was diagnosed with bipolar disorder and over the years, has been offered mood stabilizers, antidepressants, and antipsychotics. His last hospitalization was in November 2015. He did not follow up with his appointment or medication. He tells me that at that time, he did not have proper insurance that would cover his medication of Lexapro and Depakote. Two weeks ago, he relapsed on cocaine. He became increasingly depressed. A week ago, he did see her psychiatrist at [facility], who switched him to Tegretol and Celexa for affordability. The patient claims that he did get his medication and started taking them. In the past week, however, he has not been able to abstain from cocaine. He, apparently, spent his check on drugs and was thinking of suicidal again. On the day of admission, while at work, he was holding scissor to his wrist, thinking of cutting himself. This was noticed by his supervisor, who sent him to the hospital. The patient reports poor sleep, decreased appetite, anhedonia, feeling of guilt, hopelessness, worthlessness, poor energy and concentration, crying spells, social isolation, and now suicidal thoughts. He is unable to think of any precipitating factors, other than relapse on cocaine and is spending all of his money  on drugs. He denies psychotic symptoms. He denies symptoms suggestive of bipolar mania. He abuses mostly cocaine these days and has not been drinking excessively. He has a past history of alcohol abuse, as well.    PAST PSYCHIATRIC HISTORY: Multiple admissions for substance use and mood instability. There is a history of suicide attempts by drug overdose. He has been tried on Prozac, Celexa, Seroquel, Tegretol, Depakote, Zestril, Lexapro. He dislikes Seroquel for weight gain and feels that Tegretol stopped working for him at some point. He is a  patient at [facility]  now.    FAMILY PSYCHIATRIC HISTORY: Mother with depression. No completed suicides in the family.   PAST MEDICAL HISTORY: None.   ALLERGIES: TRAZODONE AND WALNUTS.   MEDICATIONS ON ADMISSION: Tegretol 200 mg twice daily, Prozac 20 mg daily.   SOCIAL HISTORY: He is originally from [place], but lived in [place] all of his life. He is separated from his wife, but they live together in the same house. They have been married for 20-some years. He has a daughter who is a Equities trader in high school. Apparently, when she graduates in May and goes to college, they will finalize the divorce and sell the house. The patient is employed at [employer]. Apparently, has full benefits there, including medications.    REVIEW OF SYSTEMS:  CONSTITUTIONAL: No fevers or chills. No weight changes.  EYES: No double or blurred vision.  EARS, NOSE, AND THROAT: No hearing loss.  RESPIRATORY: No shortness of breath or cough.  CARDIOVASCULAR: No chest pain or orthopnea.  GASTROINTESTINAL: No abdominal pain, nausea, vomiting, or diarrhea.  GENITOURINARY: No incontinence  or frequency.  ENDOCRINE: No heat or cold intolerance.  LYMPHATIC: No anemia or easy bruising.  INTEGUMENTARY: No acne or rash.  MUSCULOSKELETAL: No muscle or joint pain.  NEUROLOGIC: No tingling or weakness.  PSYCHIATRIC: See history of present illness for details.   PHYSICAL  EXAMINATION: VITAL SIGNS: Blood pressure 114/76, pulse 54, respirations 20, temperature 98.2.  GENERAL: This is a well-developed male in no acute distress.  HEENT: The pupils are equal, round, and reactive to light. Sclerae are anicteric.  NECK: Supple. No thyromegaly.  LUNGS: Clear to auscultation. No dullness to percussion.  HEART: Regular rhythm and rate. No murmurs, rubs, or gallops.  ABDOMEN: Soft, nontender, nondistended. Positive bowel sounds.  MUSCULOSKELETAL: Normal muscle strength in all extremities.  SKIN: No rashes or bruises.  LYMPHATIC: No cervical adenopathy.  NEUROLOGIC: Cranial nerves II through XII are intact.   LABORATORY DATA: Chemistries are within normal limits. Blood alcohol level of 0. LFTs within normal limits. Urine tox screen positive for benzodiazepine and cocaine. CBC within normal limits. Urinalysis is not suggestive of urinary tract infection. Serum acetaminophen and salicylates are low.   MENTAL STATUS EXAMINATION ON ADMISSION: The patient is alert and oriented to person, place, time, and situation. He is pleasant, polite, and cooperative. He is well-groomed and casually dressed. He recognizes me from previous admissions. He maintains limited eye contact. His speech is rather soft. Mood is depressed with flat affect. Thought process is logical and goal-oriented. Thought content: He denies suicidal or homicidal ideation here and is able to contract for safety, but was admitted for a suicide gesture, prior to coming to the hospital, at work. There are no delusions or paranoia. There are no auditory or visual hallucinations. His cognition is grossly intact. Registration, recall, short and long-term memory are intact. He is of average intelligence and fund of knowledge. His insight and judgment are poor.   SUICIDE RISK ASSESSMENT ON ADMISSION: This is a patient with a long history of substance abuse, mood instability, medication noncompliance, and suicidal attempts in the  past, who was admitted for suicidal ideation with a plan to cut his wrists with scissors.   INITIAL DIAGNOSES:  AXIS I:  Bipolar 2 disorder, cocaine use disorder, severe; alcohol use disorder, moderate.  AXIS II: Deferred.  AXIS III: Deferred.   PLAN: The patient was admitted to [place] unit for safety, stabilization, and medication management.  1. Suicidal ideation. The patient is able to contract for safety in the hospital.  2. Mood. Dr. Weber Cooks started him on Depakote and Lexapro. We will continue that.  3. Alcohol detoxification: The patient denies heavy alcohol use lately. We will monitor for symptoms of withdrawal.  4. Substance abuse treatment. He is not interested in residential treatment, has done it several times in the past, never able to maintain sobriety. He will go to his appointment at [facility] and consider participation in a [facility] program.  5. Disposition: He will be discharged to home. Follow up with [facility].    ____________________________ Wardell Honour Bary Leriche, MD jbp:mw D: 03/27/2014 14:09:00 ET T: 03/27/2014 14:44:20 ET JOB#: [id number]  cc: Kodah Maret B. Bary Leriche, MD, <Dictator> Clovis Fredrickson MD ELECTRONICALLY SIGNED 04/08/2014 21:52

## 2014-06-24 NOTE — Consult Note (Signed)
PATIENT NAME:  Shaun Colon, Shaun Colon MR#:  [id number] DATE OF BIRTH:  Mar 18, 1970  DATE OF CONSULTATION:  04/24/2014  IDENTIFYING INFORMATION AND REASON FOR CONSULTATION: A 44 year old man with a history of major depression and recurrent substance abuse came voluntarily to the hospital.   CHIEF COMPLAINT: "I was thinking about getting a knife."   HISTORY OF PRESENT ILLNESS: Information obtained from the patient and the chart. The patient states that after being here in the hospital last month, he went back to work immediately. He has been back working at [employer]. He describes that as being one of his stressors. He claims that he had stayed off of drugs for most of all that time, but this weekend he relapsed and used cocaine. However, he claims that he had actually become very depressed and was having thoughts about killing himself before relapsing. He says he is not sleeping well. He stopped taking most of his medicine, it sounds like, except for maybe the Lexapro. His appetite has been poor and he has been losing weight. He has been feeling hopeless and run down. He started back to having serious suicidal thoughts about wanting to cut himself. He is vague in his responses about hallucinations, suggesting that he may have some question about how well has been reality testing. Major stress included, still, his difficult relationship with his wife, from whom he is separated despite living in the same house and still finding his work stressful.   PAST PSYCHIATRIC HISTORY: Several prior hospitalizations with a diagnosis of major depression, as well as alcohol and cocaine abuse. He has been able to stay sober for weeks at a time. He does have a history of suicide attempts in the past. He has recently been treated with a combination of mood stabilizers and antidepressants. He is going to [facility] and seeing a counselor there.   SOCIAL HISTORY: Married and has a teenage daughter. He and his wife are separated, but still live  in the same house, which is an awkward, stressful situation. He works at [employer]. He says that he finds the actual labor to be non-stressful, but all of his interactions with other human beings are stressful for him.   FAMILY HISTORY: Describes some history of depression.   SUBSTANCE ABUSE HISTORY: Has been using cocaine for a couple of years now intermittently. Also abuses alcohol intermittently. No clearcut history of DTs or seizures identified. He has been engaged in recent substance abuse treatment and has gone to inpatient treatment on a couple of occasions at the alcohol and drug abuse treatment center.   PAST MEDICAL HISTORY: No significant ongoing medical problems.   CURRENT MEDICATIONS: Lexapro 10 mg a day, Depakote 500 mg 3 times a day, zolpidem 10 mg at night, hydroxyzine 1 tablet every 6 hours as needed for anxiety, gabapentin 300 mg 3 times a day.   ALLERGIES: TRAZODONE.   REVIEW OF SYSTEMS: He notices that the last few days, he has been stuttering. Mood is depressed. He has thoughts about wanting to kill himself. He feels hopeless. Not hungry, not sleeping. No other specific physical complaints.   MENTAL STATUS EXAMINATION: Adequately groomed man, who looks his stated age, cooperative with the interview. Eye contact poor. Psychomotor activity very restricted. Speech is quiet and decreased in amount. Affect flat and tearful. Mood stated as being bad. Thoughts are a little bit scattered, but not bizarre. No obvious delusions. Denies any homicidal ideation. Endorses suicidal ideation with a wish to cut himself. It is not clear whether  he is having hallucinations. He is alert and oriented x4. He can repeat 3 words immediately, remembers only 1 of them at 3 minutes. Judgment and insight seems to have been recently somewhat impaired, but at least adequate enough to bring himself into the hospital.   LABORATORY RESULTS: His drug screen is positive for cocaine only. Chemistry panel unremarkable.  Alcohol level on presentation 72. His CBC is showing a low hematocrit at 39.7, but normal platelet count, normal white count. Urinalysis unremarkable.   VITAL SIGNS: Blood pressure 135/87, respirations 18, pulse 67, temperature 97.7.   ASSESSMENT: A 44 year old man with a history of major depression and cocaine abuse. He has returned to being very depressed with poor sleep, poor motivation, impaired concentration, poor appetite, active suicidal ideation. Also relapsed into drug abuse. Does not feel safe with being at home.   TREATMENT PLAN: Admit to psychiatry for safety. He is not under papers right now, but indicates a voluntary desire to come into the hospital. Continue medications as prescribed when he was here previously. Suicide precautions in place.   DIAGNOSIS, PRINCIPAL AND PRIMARY:   AXIS I: Major depression, severe, recurrent.   SECONDARY DIAGNOSES:  AXIS I: Cocaine abuse, severe.  Alcohol abuse, moderate to severe.   AXIS II: Deferred.   AXIS III: No diagnosis.  ____________________________ Gonzella Lex, MD jtc:ap D: 04/24/2014 12:35:47 ET T: 04/24/2014 13:00:41 ET JOB#: [id number]  cc: Gonzella Lex, MD, <Dictator> Gonzella Lex MD ELECTRONICALLY SIGNED 05/01/2014 10:40
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
## 2014-06-24 NOTE — Consult Note (Signed)
PATIENT NAME:  RIGO, LETTS MR#:  [id number] DATE OF BIRTH:  1970-09-28  DATE OF CONSULTATION:  03/26/2014  REFERRING PHYSICIAN:   CONSULTING PHYSICIAN:  Gonzella Lex, MD  IDENTIFYING INFORMATION AND REASON FOR CONSULTATION: A 44 year old man with a history of depression, who presents here at the urging of his supervisor at work because of suicidal ideation.   HISTORY OF PRESENT ILLNESS: Information obtained from the patient and the chart.    CHIEF COMPLAINT: "I was having suicidal thoughts at work."   HISTORY OF PRESENT ILLNESS: He states that he was at his job, working with a pair of scissors, when he ran them across his arm. He says he was having thoughts about killing himself. These have been happening more frequently over the last month or so. His mood has been down and sad, and more hopeless. He has had chronic difficulty sleeping at night. He has chronic vague auditory hallucinations, but not voices. Mr. Shubham had an extended period of noncompliance with medication after his last hospitalization here. Just recently, he went back to [facility] and was re-prescribed Prozac and Tegretol. Despite that, this past weekend, he also relapsed on drugs and alcohol. He used cocaine and drank a lot over the weekend. He continues to feel overwhelmed and stressed out by his situation with his wife.   PAST PSYCHIATRIC HISTORY: Several previous hospitalizations and visits to our hospital for depression, and also with a history of abuse of alcohol and cocaine. Has been referred to rehab programs, as well. History of noncompliance, or only partial compliance, with treatment. Past history of serious suicide attempts. Currently being seen at [facility].   PAST MEDICAL HISTORY: No significant ongoing medical problems.   SUBSTANCE ABUSE HISTORY: He has a history of alcohol and cocaine abuse. Has gotten involved in treatment, but is not is always fully compliant with it. Seems to make sincere efforts to try and  stay sober, but has had multiple relapses, often related to his depression getting worse.   SOCIAL HISTORY: The patient and his wife are legally separated, but continue to occupy the same house. The patient lives in a part of it that has no heating in it. It sounds like he does not stay in very close contact with the rest of his family of origin, although he occasionally talks to his mother. He has little or no other social life. He works at [employer].   CURRENT MEDICATIONS: Just recently restarted Prozac and Tegretol; doses unclear.   ALLERGIES: TRAZODONE.   FAMILY HISTORY: Mother has a history of depression.   REVIEW OF SYSTEMS: Does not have any specific physical complaints. No pain. No nausea. No pulmonary or cardiac problems. Mood is depressed and sad. Thoughts are slow. Positive suicidal thoughts.   MENTAL STATUS EXAMINATION: Adequately groomed gentleman who looks his stated age. Cooperative, but slow. Eye contact diminished. Very slow psychomotor activity. Very slow and quiet speech. Flat affect. Mood depressed. Thoughts are slow, but lucid. No obvious delusions. Denies auditory or visual hallucinations currently, but has had what sounds like some auditory hallucinations at home. Denies suicidal intent right now, but has had recent suicidal ideation. No homicidal ideation. He is alert and oriented x4. He can repeat 3 words immediately and remembers all 3 at 3 minutes. Judgment and insight somewhat impaired.   LABORATORY RESULTS: CBC all normal. Chemistry panel largely unremarkable. Salicylates, acetaminophen and alcohol all negative. We do not have a drug screen back yet.   VITAL SIGNS: Blood pressure 127/78,  respirations 20, pulse 64, temperature 98.9.   ASSESSMENT: A 44 year old man with a history of recurrent severe depression versus bipolar disorder. Also, recurrent substance abuse. Mood has been getting more and more depressed. Relapsed this weekend. Suicidal thoughts. Hopeless  and passive. History of serious suicidal behavior in the past.   TREATMENT PLAN: Admit to psychiatry. Suicide and elopement precautions in place. For now, I will continue the Depakote and Lexapro as previously ordered here, and allow the treatment team to decide on other plans. Psychoeducation completed.   DIAGNOSIS PRINCIPAL, PRINCIPAL AND PRIMARY:  AXIS I: Major depression, severe, recurrent.   SECONDARY DIAGNOSES:  AXIS I: Cocaine abuse. Alcohol abuse.  AXIS II: Deferred.  AXIS III: No diagnosis.     ____________________________ Gonzella Lex, MD jtc:MT D: 03/26/2014 14:09:00 ET T: 03/26/2014 14:20:14 ET JOB#: [id number]  cc: Gonzella Lex, MD, <Dictator> Gonzella Lex MD ELECTRONICALLY SIGNED 04/04/2014 17:25

## 2014-06-24 NOTE — H&P (Signed)
PATIENT NAME:  Shaun Colon, Shaun Colon MR#:  [id number] DATE OF BIRTH:  September 29, 1970  DATE OF ADMISSION:  04/24/2014  REFERRING PHYSICIAN: Emergency Room M.D.   ATTENDING PHYSICIAN: Saksham Akkerman B. Bary Leriche, M.D.   IDENTIFYING DATA: Shaun Colon is a 44 year old male with a history of depression, mood instability, and substance use.   CHIEF COMPLAINT: "I was looking for a knife."   HISTORY OF PRESENT ILLNESS:  Shaun Colon was hospitalized at [facility] multiple times for depression and substance use. His last hospitalization was at the beginning of February. He was discharged on the fourth. He was supposed to stay home to recuperate from work until the fifteenth; however he was unable to return to work in the first week. He returned to work for 4 days, but then stopped going again. He reports that he has been very depressed and is staying in bed all day long. He initially minimized his substance use, but eventually admitted to using cocaine at least on Sunday, 2 days prior to admission. He is uncertain if he still has a job as he has been missing work quite a bit. He was discharged on Depakote but his Depakote level on admission is zero. He reports good medication compliance but does not appear that he has been taking medicines at all. I called his pharmacy and they told me that he did not fill any prescriptions since December. The patient has been prescribed his medicines several times in the past and it is quite possible that he had some leftover medications at home.  He has been dysfunctional and depressed to the point that his wife who is now estranged started worrying about him. At one point, reportedly, the patient was looking for a knife to cut himself. He did have a suicide attempt by cutting in the past. He reports poor sleep, decreased appetite, anhedonia, feeling of guilt, hopelessness, worthlessness, poor energy and concentration, social isolation, crying spells, and now suicidal ideation.  He denies psychotic symptoms or symptoms suggestive of bipolar mania.  In addition to crack cocaine he has been drinking malt liquor more so then during his last admission. There is a history of severe drinking a few years back.   PAST PSYCHIATRIC HISTORY: There are multiple [facility] hospitalizations for substance abuse, mostly drinking, crack cocaine, mood instability, and suicide attempt. He has readily been compliant with outpatient treatment. He did see a psychiatrist at [facility] but did not start their substance abuse IOP program. He did well when he was in residential treatment at [facility]. He was able to maintain sobriety for a year going to [facility]. He has additional stressors coming from the fact that he would be estranged from his [employer]. He worries about it a lot.   PAST MEDICAL HISTORY: None.   ALLERGIES: TRAZODONE AND WALNUTS.   MEDICATIONS ON ADMISSION: Lexapro 10 mg daily, Depakote 500 mg 3 times daily, Ambien 5 mg at bedtime, and Neurontin 300 mg twice daily.   SOCIAL HISTORY: He is married and still lives in the house with his wife and 2 year old daughter.  They are estranged. Reportedly, the patient and his wife agreed that they would stay in the same house together to pay the mortgage jointly until his daughter graduates from high school in May. When she goes to college they intend to split and probably sell the house. The patient is employed by [employer] but is uncertain whether or not he still has a job. His support is provided  by his mother. His church apparently ostracized him. He considers moving to an [employer] but realizes that his family will not be able to afford the mortgage. It is unclear how he contributes to the household if he uses cocaine. He complains that drug dealers come to his house all the time and it is difficult or impossible for him to resist.   REVIEW OF SYSTEMS: CONSTITUTIONAL: No fevers or chills. No weight changes.   EYES: No double or blurred vision.  ENT: No hearing loss.   RESPIRATORY: No shortness of breath or cough.  CARDIOVASCULAR: No chest pain or orthopnea.  GASTROINTESTINAL: No abdominal pain, nausea, vomiting, or diarrhea.  GENITOURINARY: No incontinence or frequency.  ENDOCRINE: No heat or cold intolerance.  LYMPHATIC: No anemia or easy bruising.  INTEGUMENTARY: No acne or rash.  MUSCULOSKELETAL: No muscle or joint pain.  NEUROLOGIC: No tingling or weakness.  PSYCHIATRIC: See history of present illness for details.   PHYSICAL EXAMINATION:  VITAL SIGNS: Blood pressure is 125/85, pulse 77, respirations 18, temperature 98.3.   GENERAL: This is a well-developed male in no acute distress.  HEENT: The pupils are equal, round, and reactive to light. Sclerae are anicteric.  NECK: Supple. No thyromegaly.  LUNGS: Clear to auscultation. No dullness to percussion.  HEART: Regular rhythm and rate. No members, rubs, or gallops.  ABDOMEN: Soft, nontender, nondistended. Positive bowel sounds.  MUSCULOSKELETAL: Normal muscle strength in all extremities.  SKIN: No rashes or bruises.  LYMPHATIC: No cervical adenopathy.  NEUROLOGIC: Cranial nerves II through XII are intact.   LABORATORY DATA: Chemistries are within normal limits. Blood alcohol level is 72. LFTs within normal limits. Depakote level less than 3. Urine toxicology screen positive for cocaine. CBC within normal limits. Urinalysis is not suggestive of urinary tract infection. Serum acetaminophen and salicylates are low.   MENTAL STATUS EXAMINATION ON ADMISSION: The patient is alert and oriented to person, place, time, and situation. He is pleasant, polite, and cooperative. There is severe psychomotor retardation. He is well groomed and casually dressed. He maintains limited eye contact. His speech is soft. There is paucity of speech. His mood is depressed with tearful affect. Thought process is logical and goal oriented, but slow. He still  endorses thoughts of hurting himself. He is hopeless, ashamed, and disappointed in himself. There are no thoughts of hurting others. There are no delusions or paranoia. There are no auditory or visual hallucinations. His cognition is grossly intact. Registration, recall, short and long-term memory are intact. He is of average intelligence and fund of knowledge. His insight and judgment are limited.   SUICIDE RISK ASSESSMENT ON ADMISSION: This is a patient with a long history of substance abuse, mood instability, and suicide attempts who came to the hospital after another relapse on cocaine faced with exclusion from his church, job loss, and family conflict. He is at increased risk of suicide.   INITIAL DIAGNOSES:  AXIS I:  1.  Major depressive disorder, recurrent, severe, rule out bipolar mood disorder.  2.  Cocaine use disorder, severe.  3.  Alcohol use disorder, severe.  AXIS II: Deferred.  AXIS III: Deferred.   PLAN: The patient was admitted to [facility] behavioral medicine unit for safety, stabilization, and medication management.  1.  Suicidal ideation. He is able to contract for safety in the hospital.  2.  Mood. He was restarted on Depakote and Lexapro in the Emergency Room. He tells me now that he prefers to be on Lexapro and  Tegretol. We will fix that. 3.  Insomnia. We will continue Ambien.  4.  Substance use. The patient does not think he needs alcohol detoxification. We will monitor for symptoms of withdrawal.  5.  Substance abuse treatment. He is not interested in residential treatment at the moment, but wants to follow up in IOP.   DISPOSITION: To be established, most likely will return to home and follow up with [facility].    ____________________________ Wardell Honour Bary Leriche, MD jbp:mc D: 04/25/2014 11:50:00 ET T: 04/25/2014 12:25:04 ET JOB#: [id number]  cc: Rateel Beldin B. Bary Leriche, MD, <Dictator> Clovis Fredrickson MD ELECTRONICALLY SIGNED 05/07/2014 7:27

## 2014-08-17 DIAGNOSIS — F1020 Alcohol dependence, uncomplicated: Secondary | ICD-10-CM | POA: Insufficient documentation

## 2014-09-24 LAB — DRUG SCREEN, URINE
Amphetamines, Ur Screen: NEGATIVE
BARBITURATES, UR SCREEN: NEGATIVE
Benzodiazepine, Ur Scrn: NEGATIVE
COCAINE METABOLITE, UR ~~LOC~~: POSITIVE
Cannabinoid 50 Ng, Ur ~~LOC~~: NEGATIVE
MDMA (Ecstasy)Ur Screen: NEGATIVE
Methadone, Ur Screen: NEGATIVE
Opiate, Ur Screen: NEGATIVE
PHENCYCLIDINE (PCP) UR S: NEGATIVE
Tricyclic, Ur Screen: NEGATIVE

## 2014-09-24 LAB — COMPREHENSIVE METABOLIC PANEL
ALBUMIN: 4.3 g/dL
ALT: 16 U/L — AB
Alkaline Phosphatase: 40 U/L
Anion Gap: 11 (ref 7–16)
BUN: 12 mg/dL
Bilirubin,Total: 0.5 mg/dL
CHLORIDE: 105 mmol/L
Calcium, Total: 9 mg/dL
Co2: 27 mmol/L
Creatinine: 1.1 mg/dL
EGFR (African American): >60
EGFR (Non-African Amer.): >60
Glucose: 83 mg/dL
Potassium: 3.8 mmol/L
SGOT(AST): 16 U/L
Sodium: 143 mmol/L
TOTAL PROTEIN: 7.4 g/dL

## 2014-09-24 LAB — URINALYSIS, COMPLETE
BLOOD: NEGATIVE
Bacteria: NONE SEEN
Bilirubin,UR: NEGATIVE
Glucose,UR: NEGATIVE mg/dL (ref 0–75)
Ketone: NEGATIVE
Leukocyte Esterase: NEGATIVE
NITRITE: NEGATIVE
PH: 5 (ref 4.5–8.0)
PROTEIN: NEGATIVE
RBC,UR: NONE SEEN /HPF (ref 0–5)
SPECIFIC GRAVITY: 1.013 (ref 1.003–1.030)
Squamous Epithelial: NONE SEEN
WBC UR: NONE SEEN /HPF (ref 0–5)

## 2014-09-24 LAB — CBC
HCT: 39.9 % — AB (ref 40.0–52.0)
HGB: 12.6 g/dL — ABNORMAL LOW (ref 13.0–18.0)
MCH: 26.1 pg (ref 26.0–34.0)
MCHC: 31.6 g/dL — ABNORMAL LOW (ref 32.0–36.0)
MCV: 83 fL (ref 80–100)
Platelet: 172 10*3/uL (ref 150–440)
RBC: 4.84 10*6/uL (ref 4.40–5.90)
RDW: 14.7 % — AB (ref 11.5–14.5)
WBC: 4.6 10*3/uL (ref 3.8–10.6)

## 2014-09-24 LAB — ETHANOL: ETHANOL LVL: 143 mg/dL

## 2014-09-24 LAB — SALICYLATE LEVEL: Salicylates, Serum: <4 mg/dL

## 2014-09-24 LAB — ACETAMINOPHEN LEVEL

## 2014-10-26 ENCOUNTER — Other Ambulatory Visit: Payer: Self-pay | Admitting: Oncology

## 2016-04-03 ENCOUNTER — Emergency Department (HOSPITAL_COMMUNITY)
Admission: EM | Admit: 2016-04-03 | Discharge: 2016-04-03 | Disposition: A | Discharge status: Home / Self Care | Payer: Self-pay | Attending: Emergency Medicine | Admitting: Emergency Medicine

## 2016-04-03 ENCOUNTER — Encounter (HOSPITAL_COMMUNITY): Payer: Self-pay | Admitting: *Deleted

## 2016-04-03 DIAGNOSIS — Z79899 Other long term (current) drug therapy: Secondary | ICD-10-CM | POA: Insufficient documentation

## 2016-04-03 DIAGNOSIS — F17200 Nicotine dependence, unspecified, uncomplicated: Secondary | ICD-10-CM | POA: Insufficient documentation

## 2016-04-03 DIAGNOSIS — T401X1A Poisoning by heroin, accidental (unintentional), initial encounter: Secondary | ICD-10-CM | POA: Insufficient documentation

## 2016-04-03 LAB — ETHANOL: Alcohol, Ethyl (B): <5 mg/dL (ref <5)

## 2016-04-03 LAB — SALICYLATE LEVEL

## 2016-04-03 LAB — ACETAMINOPHEN LEVEL: Acetaminophen (Tylenol), Serum: <10 ug/mL — ABNORMAL LOW (ref 10–30)

## 2016-04-03 MED ORDER — NALOXONE HCL 0.4 MG/ML IJ SOLN
0.4000 mg | Freq: Once | INTRAMUSCULAR | Status: AC
  Administered 2016-04-03: 0.4 mg via INTRAMUSCULAR
  Filled 2016-04-03: qty 1

## 2016-04-03 NOTE — ED Provider Notes (Signed)
[facility] DEPT Provider Note   CSN: [id number] Arrival date & time: 04/03/16  2006     History   Chief Complaint Chief Complaint  Patient presents with  . Drug Overdose    HPI Shaun Colon is a 46 y.o. male. Patient was found outside a motel with pinpoint pupils and minimal respiratory effort. He improved with Narcan per paramedics. Given additional Narcan upon his arrival here and becomes awake and alert. States he remembers shooting up. States he was told it was heroin. Denies any other ingestions. States this was recreational use.  HPI  History reviewed. No pertinent past medical history.  There are no active problems to display for this patient.   History reviewed. No pertinent surgical history.     Home Medications    Prior to Admission medications   Medication Sig Start Date End Date Taking? Authorizing Provider  escitalopram (LEXAPRO) 20 MG tablet Take 20 mg by mouth daily. 08/20/14  Yes Historical Provider, MD  FLUoxetine (PROZAC) 40 MG capsule Take 40 mg by mouth daily.   Yes Historical Provider, MD  gabapentin (NEURONTIN) 300 MG capsule Take 600 mg by mouth 3 (three) times daily. 08/20/14  Yes Historical Provider, MD  QUEtiapine (SEROQUEL) 200 MG tablet Take 200 mg by mouth at bedtime.   Yes Historical Provider, MD    Family History No family history on file.  Social History Social History  Substance Use Topics  . Smoking status: Current Every Day Smoker  . Smokeless tobacco: Never Used  . Alcohol use No     Allergies   Trazodone   Review of Systems Review of Systems  Constitutional: Negative for appetite change, chills, diaphoresis, fatigue and fever.       Feels "tired"  HENT: Negative for mouth sores, sore throat and trouble swallowing.   Eyes: Negative for visual disturbance.  Respiratory: Negative for cough, chest tightness, shortness of breath and wheezing.   Cardiovascular: Negative for chest pain.  Gastrointestinal: Negative for  abdominal distention, abdominal pain, diarrhea, nausea and vomiting.  Endocrine: Negative for polydipsia, polyphagia and polyuria.  Genitourinary: Negative for dysuria, frequency and hematuria.  Musculoskeletal: Negative for gait problem.  Skin: Negative for color change, pallor and rash.  Neurological: Negative for dizziness, syncope, light-headedness and headaches.  Hematological: Does not bruise/bleed easily.  Psychiatric/Behavioral: Negative for behavioral problems and confusion.     Physical Exam Updated Vital Signs BP 96/56   Pulse (!) 54   Temp 97.8 F (36.6 C)   Resp 14   SpO2 100%   Physical Exam  Constitutional: He appears well-developed and well-nourished. No distress.  HENT:  Head: Normocephalic.  Eyes: Conjunctivae are normal. Pupils are equal, round, and reactive to light. No scleral icterus.  Neck: Normal range of motion. Neck supple. No thyromegaly present.  Cardiovascular: Normal rate and regular rhythm.  Exam reveals no gallop and no friction rub.   No murmur heard. Pulmonary/Chest: Effort normal and breath sounds normal. No respiratory distress. He has no wheezes. He has no rales.  Abdominal: Soft. Bowel sounds are normal. He exhibits no distension. There is no tenderness. There is no rebound.  Musculoskeletal: Normal range of motion.  Neurological:  Is able to speak with me. Still somewhat somnolent. Pupils 1-2 mm. Given additional amp Narcan. Becomes much more awake alert.  Skin: Skin is warm and dry. No rash noted.  Psychiatric: He has a normal mood and affect. His behavior is normal.     ED Treatments / Results  Labs (all labs ordered are listed, but only abnormal results are displayed) Labs Reviewed  ACETAMINOPHEN LEVEL - Abnormal; Notable for the following:       Result Value   Acetaminophen (Tylenol), Serum <10 (*)    All other components within normal limits  ETHANOL  SALICYLATE LEVEL    EKG  EKG Interpretation None        Radiology No results found.  Procedures Procedures (including critical care time)  Medications Ordered in ED Medications  naloxone [facility]) injection 0.4 mg (0.4 mg Intramuscular Given 04/03/16 2121)     Initial Impression / Assessment and Plan / ED Course  I have reviewed the triage vital signs and the nursing notes.  Pertinent labs & imaging results that were available during my care of the patient were reviewed by me and considered in my medical decision making (see chart for details).     She remains awake alert ambulatory here. Appropriate for discharge. No signs of recurrence  Final Clinical Impressions(s) / ED Diagnoses   Final diagnoses:  Accidental overdose of heroin, initial encounter    New Prescriptions New Prescriptions   No medications on file     Tanna Furry, MD 04/03/16 2321

## 2016-04-03 NOTE — Discharge Instructions (Signed)
Without treatment for your overdose tonight, you would have died in the parking lot. Recreational heroin use is often fatal.

## 2016-04-03 NOTE — ED Triage Notes (Signed)
Per EMS, pt was found unresponsive laying outside with pinpoint pupils of a hotel room. Pt was given 0.4 narcan IV. Pt denies nausea.

## 2016-04-03 NOTE — ED Notes (Signed)
Pt ambulated without difficulty; urinal provided to pt

## 2016-06-12 DIAGNOSIS — F10930 Alcohol use, unspecified with withdrawal, uncomplicated: Secondary | ICD-10-CM | POA: Insufficient documentation

## 2016-06-13 DIAGNOSIS — F1024 Alcohol dependence with alcohol-induced mood disorder: Secondary | ICD-10-CM | POA: Insufficient documentation

## 2016-09-07 DIAGNOSIS — F1410 Cocaine abuse, uncomplicated: Secondary | ICD-10-CM | POA: Insufficient documentation

## 2020-10-02 ENCOUNTER — Other Ambulatory Visit: Payer: Self-pay

## 2020-10-02 ENCOUNTER — Ambulatory Visit
Admission: EM | Admit: 2020-10-02 | Discharge: 2020-10-02 | Disposition: A | Discharge status: Home / Self Care | Payer: No Typology Code available for payment source | Attending: Emergency Medicine | Admitting: Emergency Medicine

## 2020-10-02 ENCOUNTER — Encounter: Payer: Self-pay | Admitting: Emergency Medicine

## 2020-10-02 DIAGNOSIS — F410 Panic disorder [episodic paroxysmal anxiety] without agoraphobia: Secondary | ICD-10-CM

## 2020-10-02 DIAGNOSIS — F320 Major depressive disorder, single episode, mild: Secondary | ICD-10-CM | POA: Diagnosis not present

## 2020-10-02 DIAGNOSIS — G4700 Insomnia, unspecified: Secondary | ICD-10-CM | POA: Diagnosis not present

## 2020-10-02 DIAGNOSIS — F32A Depression, unspecified: Secondary | ICD-10-CM

## 2020-10-02 DIAGNOSIS — F411 Generalized anxiety disorder: Secondary | ICD-10-CM | POA: Diagnosis not present

## 2020-10-02 MED ORDER — HYDROXYZINE HCL 25 MG PO TABS: 25.0000 mg | ORAL_TABLET | Freq: Four times a day (QID) | ORAL | 0 refills | Status: AC | PRN

## 2020-10-02 NOTE — ED Triage Notes (Signed)
Pt here with worsening anxiety and depression x 3 weeks after stopping seroquel. Has appt with specialist in 3 weeks, but is unable to continue with current sx. He denies any feelings of SI/HI.

## 2020-10-02 NOTE — Discharge Instructions (Addendum)
Take Atarax as prescribed.  For urgent or emergent psychiatric issues  EMERGENCY:   For psychiatric emergencies or crises, please seek immediate care.   Call 911 if it is a medical or life threatening emergency.  If you need police, ask for a [employer] Acupuncturist) officer.  In [facility]: Walk into [employer], at [address], [place], [place], [address], P# [phone] Or call for the Mobile Crisis Teams at [phone]   3.   In Other counties: [employer]ials[employer]es  4.    IF YOU NEED SOMEONE TO TALK TO RIGHT NOW:   1-800-273-TALK (1-(210)124-0811) in the [place]. 1-800-SUICIDE 506-411-1969) in the [place]. [phone] in the [place] for Spanish-speaking counselors. U1166179 [phone]) in the [place] for TTY users.  Visit the following websites for information and help: National Suicide Prevention Lifeline: www.suicidepreventionlifeline.org Hopeline: www.hopeline.[place] for Suicide Prevention: PromotionalLoans.co.za For lesbian, gay, bisexual, transgender, or questioning youth, contact [employer]: R7974166 [phone]) in the [place]. Www.thetrevorproject.org  5.    Your county, will also have local crisis services.  For [place] at [phone] For [place], [place], [place], [place], [place], [place], [place], [place] at [phone]    For other mental health needs:  1.   If you are already working with an agency or doctor on your mental health, call their crisis number.  If you don't have the number, you can call your agency's receptionist or voicemail message.  2.   If you have Medicaid or no insurance, call [facility] at [phone] to do a phone intake.  They can help connect you to crisis care or schedule an appointment for outpatient follow  up.  3.   Go to [facility] (see below for more information).   4.   Go to [facility] run by [employer], therapeutic at [address] [place], [place] [address].  [facility] is a place for emotional crisis or substance abuse detox.  It is a one story white building 2 blocks west of [facility] on [place] can call ahead at [phone] if you want to talk about what to expect.  They are open 24/7/365.  5.   If you don't think you are safe to get to [facility] by yourself, you can call [phone] and ask for the Mobile Crisis Team.  This team may be able to come to where you are and/or take you to [facility]. They will talk through your options with you and help you stay safe.  6.    If you think you, or someone else, is in immediate physical danger, you can call [employer] at A999333 and ask for a Administrator, arts.  These police officers are specially trained in calming down a crisis.  For assistance with substance abuse:  [place] provide licensed clinicians to meet with you and help assess your treatment needs. If immediate services are needed, staff from the center will work with you and [facility] to find an appropriate treatment setting prior to you leaving the center.  There are also facilities in [place], [place] and [place] counties that are an alternative to inpatient hospital setting available within the community. They can provide stabilization services to individuals experiencing both mental health and substance abuse related crisis.  Adults (18 and older) may be assessed  and if needed admitted for mental health crisis stabilization and detoxification from drugs and/or alcohol in a safe environment at the following in-network Crisis and [place]. You can walk-in or call [phone].  Address: [place], [place] (24 hours a day)  2.    [facility] [employer] [facility] run by [employer], therapeutic at [address] [place], [place] [address].  [facility] is a place for emotional crisis or substance abuse detox.  It is a one story white building 2 blocks west of [facility] on [place] can call ahead at [phone] if you want to talk about what to expect.  They are open 24/7/365. 3.    SMART Recovery - Self Management for Addiction Recovery SMART Recovery meetings in the [place] area are listed below.  Click on the location (in blue) for full details including directions, a map, and contact information. [place], 10:00 AM, Saturdays, [employer] [place], 7:30 PM Mondays, [place]. [place], 7:30 PM, Tuesdays, [place]. [place], 7:30 PM Wednesdays, [place]. [place], 7:30 PM Thursdays, [place]. There also are other SMART Recovery meetings in [place].  Check the national website for more information. 4.    Alcoholics Anonymous (AA)  [employer] Phone: [phone] ___________________________________  [facility]  Location: [address] [place], [place] Next to [employer] and across from the [place].  There is a white picket fence outside.  Use the rear entrance for the [facility]. Phone Number:  [phone] Fax Number:  [phone]  Hours of Operation Monday - Thursday: 8:00am - 7:00pm Friday: 8:00am - 3:00pm Saturday: 9:00am - 12:00pm Sunday: Closed  Frequently Asked Questions  Who Do We Serve? Individuals four years of age and older within the [place] area who are having a mental health crisis or substance abuse problems.  Why Come to the  [facility]? You need to see your psychiatrist or therapist immediately and they are unavailable. You want to initiate or re-engage in mental health services. You need a medication bridge until you can obtain an appointment for routine care  You have experienced a recent trauma and need help managing your thoughts and feelings, and/or planning next steps  You have stopped receiving behavioral health services and want to re-engage in treatment immediately  You need immediate assistance but prefer to not go to an emergency department  You have been barred from attending school until you receive a psychological assessment  You need assistance administering an injectable medication   What Should I Bring? Medicaid card and/or photograph identification if possible. What insurance is accepted at the [facility]? Medicaid from [place], [place], [place] and [place].  They also accept uninsured patients from the same counties, as [employer] funding is available.  If you have other insurance, there will be an out of pocket cost for the services.    When should I go to the hospital or 24 hour crisis center instead of the [facility]? If you are experiencing one of the following situations, you should seek care at a hospital or 24 hour crisis center such as the [facility]) or [employer] [place]).  You are seeking drug/alcohol detoxification  You are very intoxicated  You have urgent medical needs  You think you will need hospitalization  ___________________________________ [facility] [address] [place], [place] CALL: [phone] Walk In Intakes available between 9 am-3 pm Monday-Friday These fill up quickly, so arrive EARLY (8:45 am).  You may have to wait to be seen.   From the center of [place] or from [place]: From the city center take [address] past [address].  From [address] take the [address] [place].  From the [address]  exit drive 1.2 miles [place].  Turn right on [address].  [facility] is on the left. From the [place]: Take [address], [place], towards [place] becomes [employer].  At the [address] junction bear right on [address] and drive one mile.  Turn left on [address].  [facility] is on the left.  Services include: Comprehensive Clinical Assessment  Individual and Group Therapy  Family Therapy  Substance Abuse Comprehensive Outpatient Treatment (SACOT)  Substance Abuse Intensive Outpatient Program Presenter, broadcasting)  Peer Support  Psychiatric Evaluation and medication management  Primary Medical Care ([place], [place])   [place] [address], [place], [place] [address] [phone] NO APPOINTMENT NECESSARY  Same-Day Walk-in Care

## 2020-10-02 NOTE — ED Provider Notes (Signed)
CHIEF COMPLAINT:   Chief Complaint  Patient presents with   Anxiety   Depression     SUBJECTIVE/HPI:   Anxiety  Depression  A very pleasant 50 y.o.Male presents today with anxiety and depression which has worsened over the last 3 weeks.  Patient states that he did recently stop using Seroquel about 1 month ago.  Patient reports he has an appointment in approximately 3 to 4 weeks with a specialist, but states that he has been having panic attack episodes where he will work himself up as he is concerned that he is going to have another panic attack.  Patient states that he has been hospitalized for panic attacks in the past.  Patient also reports that he is unable to sleep.  He reports home stressors and states that he is also getting ready to change jobs which has been stressful for him as well.  Patient reports that he has taken himself off of Lexapro, Prozac, Seroquel.  Patient states that he is not currently established with a PCP.  Patient states that he has been taking his wife's Wellbutrin and states that he is currently still taking gabapentin.  Patient does not report any improvement in symptoms with these medications.  Patient denies any feelings of self-harm or suicidal ideations.  He does not report having a plan. Patient does not report any shortness of breath, chest pain, palpitations, visual changes, weakness, tingling, headache, nausea, vomiting, diarrhea, fever, chills.   has no past medical history on file. ROS:  Review of Systems  Psychiatric/Behavioral:  Positive for depression.   See Subjective/HPI Medications, Allergies and Problem List personally reviewed in Epic today OBJECTIVE:   Vitals:   10/02/20 1121  BP: 134/90  Pulse: 64  Resp: 20  Temp: 97.8 F (36.6 C)  SpO2: 98%    Physical Exam   General: Appears well-developed and well-nourished. No acute distress.  HEENT Head: Normocephalic and atraumatic.  Ears: Hearing grossly intact, no drainage or visible  deformity.  Eyes: Conjunctivae and EOM are normal. No eye drainage or scleral icterus bilaterally.  Neck: Normal range of motion, neck is supple.  Cardiovascular: Normal rate  Pulm/Chest: No respiratory distress.  Neurological: Alert and oriented to person, place, and time.  Skin: Skin is warm and dry.  Psychiatric: Anxious mood, normal affect, normal behavior, and normal thought content.   Vital signs and nursing note reviewed.   Patient stable and cooperative with examination.  LABS/X-RAYS/EKG/MEDS:   No results found for any visits on 10/02/20.  MEDICAL DECISION MAKING:   Patient presents with anxiety and depression which has worsened over the last 3 weeks.  Patient states that he did recently stop using Seroquel about 1 month ago.  Patient reports he has an appointment in approximately 3 to 4 weeks with a specialist, but states that he has been having panic attack episodes where he will work himself up as he is concerned that he is going to have another panic attack.  Patient states that he has been hospitalized for panic attacks in the past.  Patient also reports that he is unable to sleep.  He reports home stressors and states that he is also getting ready to change jobs which has been stressful for him as well.  Patient reports that he has taken himself off of Lexapro, Prozac, Seroquel.  Patient states that he is not currently established with a PCP.  Patient states that he has been taking his wife's Wellbutrin and states that he is currently still  taking gabapentin.  Patient does not report any improvement in symptoms with these medications.  Patient denies any feelings of self-harm or suicidal ideations.  He does not report having a plan. Patient does not report any shortness of breath, chest pain, palpitations, visual changes, weakness, tingling, headache, nausea, vomiting, diarrhea, fever, chills.  Chart review completed.  Given symptoms, likely generalized anxiety disorder with panic  attacks, uncontrolled depression, insomnia.  Rx'd Atarax to the patient's preferred pharmacy and advised of outside resources for which she would be able to follow-up with for continued mental health management.  Advised of strict emergency precautions for suicidal ideations, worsening of symptoms, feelings of self-harm.  Patient verbalized understanding and agreed with plan.  Patient stable upon discharge.  Return as needed.  ASSESSMENT/PLAN:  1. Generalized anxiety disorder with panic attacks - hydrOXYzine (ATARAX/VISTARIL) 25 MG tablet; Take 1 tablet (25 mg total) by mouth every 6 (six) hours as needed.  Dispense: 120 tablet; Refill: 0  2. Depression, uncontrolled  3. Insomnia, unspecified type - hydrOXYzine (ATARAX/VISTARIL) 25 MG tablet; Take 1 tablet (25 mg total) by mouth every 6 (six) hours as needed.  Dispense: 120 tablet; Refill: 0  Instructions about new medications and side effects provided.  Plan:   Discharge Instructions      Take Atarax as prescribed.  For urgent or emergent psychiatric issues  EMERGENCY:   For psychiatric emergencies or crises, please seek immediate care.   Call 911 if it is a medical or life threatening emergency.  If you need police, ask for a CIT Acupuncturist) officer.  In [facility]: Walk into [employer], at [address] [place], [place], [address], P# [phone] Or call for the Mobile Crisis Teams at [phone]   3.   In Other counties: ToyTutorials[employer]es  4.    IF YOU NEED SOMEONE TO TALK TO RIGHT NOW:   1-800-273-TALK (1-6104288009) in the [place]. 1-800-SUICIDE (563) 485-2114) in the [place]. 8545841138 in the [place] for Spanish-speaking counselors. U1166179 ([phone]) in the [place] for TTY users.  Visit the following websites for information and help: National Suicide Prevention Lifeline:  www.suicidepreventionlifeline.org Hopeline: www.hopeline.[place] for Suicide Prevention: PromotionalLoans.co.za For lesbian, gay, bisexual, transgender, or questioning youth, contact [employer]: R7974166 (765)244-4513) in the [place]. Www.thetrevorproject.org  5.    Your county, will also have local crisis services.  For Index at 938-363-7490 For [place], [place], [place], [place], [place], [place], [place], [place] at ([phone]    For other mental health needs:  1.   If you are already working with an agency or doctor on your mental health, call their crisis number.  If you don't have the number, you can call your agency's receptionist or voicemail message.  2.   If you have Medicaid or no insurance, call [facility] at [phone] to do a phone intake.  They can help connect you to crisis care or schedule an appointment for outpatient follow up.  3.   Go to [facility] (see below for more information).   4.   Go to [facility] run by [employer], therapeutic at [address] [place], [place] [address].  [facility] is a place for emotional crisis or substance abuse detox.  It is a one story white building 2 blocks west of [facility] on [place] can call ahead at  [phone] if you want to talk about what to expect.  They are open 24/7/365.  5.   If you don't think you are safe to get to [facility] by yourself, you can call [phone] and ask for the Mobile Crisis Team.  This team may be able to come to where you are and/or take you to [facility]. They will talk through your options with you and help you stay safe.  6.    If you think you, or someone else, is in immediate physical danger, you can call [employer] at [phone] and ask for a Risk manager.  These police officers are specially trained in calming down a crisis.  For assistance with substance abuse:  [facility] provide licensed clinicians to meet with you and help assess your treatment needs. If immediate services are needed, staff from the center will work with you and [facility] to find an appropriate treatment setting prior to you leaving the center.  There are also facilities in [place], [place] and [place] counties that are an alternative to inpatient hospital setting available within the community. They can provide stabilization services to individuals experiencing both mental health and substance abuse related crisis.  Adults (18 and older) may be assessed and if needed admitted for mental health crisis stabilization and detoxification from drugs and/or alcohol in a safe environment at the following in-network Crisis and Assessment Centers. You can walk-in or call [phone].  Address: [place], [place] (24 hours a day)  2.    [facility] [employer] [facility] run by [employer], therapeutic at [address], [place], [place] [address].  [facility] is a place for emotional crisis or substance abuse detox.  It is a one story white building 2 blocks west of [facility] on [place] can call ahead at [phone] if you want to talk about what to expect.  They are open 24/7/365. 3.    SMART Recovery - Self Management for Addiction Recovery SMART Recovery meetings in the [place] area are listed below.  Click on the location (in blue) for full details including directions, a map, and contact information. [place], 10:00 AM, Saturdays, [employer] [place], 7:30 PM Mondays, [place]. [place], 7:30 PM, Tuesdays, [place]. [place], 7:30 PM Wednesdays,  [place]. [place], 7:30 PM Thursdays, [place]. There also are other SMART Recovery meetings in [place].  Check the national website for more information. 4.    Alcoholics Anonymous (AA)  [employer].ca Phone: [phone] ___________________________________  [facility]  Location: [address] [place] to [employer] and across from the [place].  There is a white picket fence outside.  Use the rear entrance for the [facility]. Phone Number:  [phone] Fax Number:  [phone]  Hours of Operation Monday - Thursday: 8:00am - 7:00pm Friday: 8:00am - 3:00pm Saturday: 9:00am - 12:00pm Sunday: Closed  Frequently Asked Questions  Who Do We Serve? Individuals four years of age and older within the [place] area who are having a mental health crisis or substance abuse problems.  Why Come to the [facility]? You need to see your psychiatrist or therapist immediately and they are unavailable. You want to initiate or re-engage in mental health services. You need a medication bridge  until you can obtain an appointment for routine care  You have experienced a recent trauma and need help managing your thoughts and feelings, and/or planning next steps  You have stopped receiving behavioral health services and want to re-engage in treatment immediately  You need immediate assistance but prefer to not go to an emergency department  You have been barred from attending school until you receive a psychological assessment  You need assistance administering an injectable medication   What Should I Bring? Medicaid card and/or photograph identification if possible. What insurance is accepted at the [facility]? Medicaid from [place], [place], [place] and [place].  They also accept uninsured patients from the same counties, as  [employer] funding is available.  If you have other insurance, there will be an out of pocket cost for the services.    When should I go to the hospital or 24 hour crisis center instead of the [facility]? If you are experiencing one of the following situations, you should seek care at a hospital or 24 hour crisis center such as the [place] [facility]) or [employer] [place]).  You are seeking drug/alcohol detoxification  You are very intoxicated  You have urgent medical needs  You think you will need hospitalization  ___________________________________ [facility] [address] [place], [place] CALL: [phone] Walk In Intakes available between 9 am-3 pm Monday-Friday These fill up quickly, so arrive EARLY (8:45 am).  You may have to wait to be seen.   From the center of [place] or from [place]: From the city center take [address] past [address].  From [address] take the [address] exit [place].  From the [address] exit drive 1.2 miles [place].  Turn right on [address].  [facility] is on the left. From the [place]: Take [address], [place], towards [place] becomes [employer].  At the [address] junction bear right on [address] and drive one mile.  Turn left on [address].  [facility] is on the left.  Services include: Comprehensive Clinical Assessment  Individual and Group Therapy  Family Therapy  Substance Abuse Comprehensive Outpatient Treatment (SACOT)  Substance Abuse Intensive Outpatient Program [facility])  Peer Support  Psychiatric Evaluation and medication management  Primary Medical Care ([place], [place])   [place] [facility] [address], [place], [place] [address] [phone] NO APPOINTMENT NECESSARY  Same-Day [place], [place] 10/02/20 1147

## 2020-12-10 ENCOUNTER — Other Ambulatory Visit: Payer: Self-pay | Admitting: Family Medicine

## 2020-12-10 ENCOUNTER — Ambulatory Visit
Admission: RE | Admit: 2020-12-10 | Discharge: 2020-12-10 | Disposition: A | Discharge status: Home / Self Care | Payer: No Typology Code available for payment source | Attending: Family Medicine | Admitting: Family Medicine

## 2020-12-10 ENCOUNTER — Other Ambulatory Visit: Payer: Self-pay

## 2020-12-10 ENCOUNTER — Ambulatory Visit
Admission: RE | Admit: 2020-12-10 | Discharge: 2020-12-10 | Disposition: A | Discharge status: Home / Self Care | Payer: No Typology Code available for payment source | Source: Ambulatory Visit | Attending: Family Medicine | Admitting: Family Medicine

## 2020-12-10 DIAGNOSIS — R112 Nausea with vomiting, unspecified: Secondary | ICD-10-CM | POA: Insufficient documentation

## 2021-03-18 ENCOUNTER — Other Ambulatory Visit: Payer: Self-pay

## 2021-05-05 ENCOUNTER — Ambulatory Visit (INDEPENDENT_AMBULATORY_CARE_PROVIDER_SITE_OTHER): Payer: No Typology Code available for payment source | Admitting: Gastroenterology

## 2021-05-05 ENCOUNTER — Encounter: Payer: Self-pay | Admitting: Gastroenterology

## 2021-05-05 ENCOUNTER — Other Ambulatory Visit: Payer: Self-pay

## 2021-05-05 VITALS — BP 133/84 | HR 64 | Temp 98.5°F | Ht 70.0 in | Wt 223.0 lb

## 2021-05-05 DIAGNOSIS — D500 Iron deficiency anemia secondary to blood loss (chronic): Secondary | ICD-10-CM | POA: Diagnosis not present

## 2021-05-05 MED ORDER — NA SULFATE-K SULFATE-MG SULF 17.5-3.13-1.6 GM/177ML PO SOLN: 1.0000 | Freq: Once | ORAL | 0 refills | Status: AC

## 2021-05-05 NOTE — Addendum Note (Signed)
Addended by: Lurlean Nanny on: 05/05/2021 05:28 PM ? ? Modules accepted: Orders ? ?

## 2021-05-05 NOTE — Progress Notes (Signed)
? ?Gastroenterology Consultation ? ?Referring Provider:     Lynnell Jude, MD ?Primary Care Physician:  Shaun Jude, MD ?Primary Gastroenterologist:  Dr. Allen Colon     ?Reason for Consultation:     Iron deficiency anemia ?      ? HPI:   ?Shaun Colon is a 51 y.o. y/o male referred for consultation & management of Iron deficiency anemia by Dr. Clemmie Colon, Shaun Jude, MD.  This patient comes in today with a history of iron deficiency anemia.  The patient had a blood count that was slightly lower than the lower limit of normal with a low MCV of 77. The patient's hemoglobin was 12.2.  The patient reports that he has had some bright red blood per rectum and thought it was hemorrhoids but denies any overt rectal bleeding or black stools.  He states he has not had any change in bowel habits nor does he have any abdominal pain or change in weight.  The patient denies any family history of colon cancer colon polyps.  The patient was sent to me for evaluation for his abnormal hemoglobin and hematocrit. ? ?No past medical history on file. ? ?No past surgical history on file. ? ?Prior to Admission medications   ?Medication Sig Start Date End Date Taking? Authorizing Provider  ?escitalopram (LEXAPRO) 20 MG tablet Take 20 mg by mouth daily. 08/20/14  Yes [provider]  ?FLUoxetine (PROZAC) 40 MG capsule Take 40 mg by mouth daily.   Yes [provider]  ?gabapentin (NEURONTIN) 300 MG capsule Take 600 mg by mouth 3 (three) times daily. 08/20/14  Yes [provider]  ?QUEtiapine (SEROQUEL) 200 MG tablet Take 200 mg by mouth at bedtime.   Yes [provider]  ? ? ?No family history on file.  ? ?Social History  ? ?Tobacco Use  ? Smoking status: Every Day  ? Smokeless tobacco: Never  ?Substance Use Topics  ? Alcohol use: No  ? Drug use: No  ? ? ?Allergies as of 05/05/2021 - Review Complete 05/05/2021  ?Allergen Reaction Noted  ? Trazodone  08/15/2014  ? ? ?Review of Systems:    ?All systems reviewed and  negative except where noted in HPI. ? ? Physical Exam:  ?BP 133/84   Pulse 64   Temp 98.5 ?F (36.9 ?C) (Oral)   Ht '5\' 10"'$  (1.778 m)   Wt 223 lb (101.2 kg)   BMI 32.00 kg/m?  ?No LMP for male patient. ?General:   Alert,  Well-developed, well-nourished, pleasant and cooperative in NAD ?Head:  Normocephalic and atraumatic. ?Eyes:  Sclera clear, no icterus.   Conjunctiva pink. ?Ears:  Normal auditory acuity. ?Neck:  Supple; no masses or thyromegaly. ?Lungs:  Respirations even and unlabored.  Clear throughout to auscultation.   No wheezes, crackles, or rhonchi. No acute distress. ?Heart:  Regular rate and rhythm; no murmurs, clicks, rubs, or gallops. ?Abdomen:  Normal bowel sounds.  No bruits.  Soft, non-tender and non-distended without masses, hepatosplenomegaly or hernias noted.  No guarding or rebound tenderness.  Negative Carnett sign.   ?Rectal:  Deferred.  ?Pulses:  Normal pulses noted. ?Extremities:  No clubbing or edema.  No cyanosis. ?Neurologic:  Alert and oriented x3;  grossly normal neurologically. ?Skin:  Intact without significant lesions or rashes.  No jaundice. ?Lymph Nodes:  No significant cervical adenopathy. ?Psych:  Alert and cooperative. Normal mood and affect. ? ?Imaging Studies: ?No results found. ? ?Assessment and Plan:  ? ?Shaun Colon is a  51 y.o. y/o male who comes in today with a history of a hemoglobin being 12.2 and a low MCV at 77.  The patient was diagnosed with iron deficiency anemia and sent to me for evaluation.  The patient will be set up for an EGD and colonoscopy to look for source of GI bleeding.  The patient has been explained the risks and benefits of the procedures and agrees to being set up for these procedures.  The patient will follow up with time of the procedures. ? ? ? ?Shaun Lame, MD. [facility] ? ? ? Note: This dictation was prepared with Dragon dictation along with smaller phrase technology. Any transcriptional errors that result from this process are unintentional.    ?

## 2021-05-27 ENCOUNTER — Encounter: Payer: Self-pay | Admitting: Gastroenterology

## 2021-06-02 ENCOUNTER — Ambulatory Visit
Admission: RE | Admit: 2021-06-02 | Discharge: 2021-06-02 | Disposition: A | Discharge status: Home / Self Care | Payer: BC Managed Care – PPO | Attending: Gastroenterology | Admitting: Gastroenterology

## 2021-06-02 ENCOUNTER — Ambulatory Visit: Payer: BC Managed Care – PPO | Admitting: Anesthesiology

## 2021-06-02 ENCOUNTER — Other Ambulatory Visit: Payer: Self-pay

## 2021-06-02 ENCOUNTER — Encounter: Payer: Self-pay | Admitting: Gastroenterology

## 2021-06-02 ENCOUNTER — Encounter
Admission: RE | Disposition: A | Discharge status: Home / Self Care | Payer: Self-pay | Source: Home / Self Care | Attending: Gastroenterology

## 2021-06-02 DIAGNOSIS — K2970 Gastritis, unspecified, without bleeding: Secondary | ICD-10-CM | POA: Diagnosis not present

## 2021-06-02 DIAGNOSIS — D509 Iron deficiency anemia, unspecified: Secondary | ICD-10-CM | POA: Diagnosis present

## 2021-06-02 DIAGNOSIS — K2950 Unspecified chronic gastritis without bleeding: Secondary | ICD-10-CM | POA: Insufficient documentation

## 2021-06-02 DIAGNOSIS — K6389 Other specified diseases of intestine: Secondary | ICD-10-CM | POA: Insufficient documentation

## 2021-06-02 DIAGNOSIS — D125 Benign neoplasm of sigmoid colon: Secondary | ICD-10-CM | POA: Diagnosis not present

## 2021-06-02 DIAGNOSIS — K641 Second degree hemorrhoids: Secondary | ICD-10-CM | POA: Insufficient documentation

## 2021-06-02 DIAGNOSIS — D500 Iron deficiency anemia secondary to blood loss (chronic): Secondary | ICD-10-CM

## 2021-06-02 DIAGNOSIS — K635 Polyp of colon: Secondary | ICD-10-CM

## 2021-06-02 HISTORY — DX: Depression, unspecified: F32.A

## 2021-06-02 HISTORY — PX: ESOPHAGOGASTRODUODENOSCOPY: SHX5428

## 2021-06-02 HISTORY — DX: Other psychoactive substance abuse, uncomplicated: F19.10

## 2021-06-02 HISTORY — DX: Gastro-esophageal reflux disease without esophagitis: K21.9

## 2021-06-02 HISTORY — PX: COLONOSCOPY: SHX5424

## 2021-06-02 HISTORY — DX: Alcohol abuse, in remission: F10.11

## 2021-06-02 SURGERY — COLONOSCOPY
Anesthesia: General

## 2021-06-02 MED ORDER — SODIUM CHLORIDE 0.9 % IV SOLN: INTRAVENOUS | Status: DC

## 2021-06-02 MED ORDER — LIDOCAINE HCL (CARDIAC) PF 100 MG/5ML IV SOSY
PREFILLED_SYRINGE | INTRAVENOUS | Status: DC | PRN
  Administered 2021-06-02: 40 mg via INTRAVENOUS

## 2021-06-02 MED ORDER — STERILE WATER FOR IRRIGATION IR SOLN: Status: DC | PRN

## 2021-06-02 MED ORDER — ACETAMINOPHEN 500 MG PO TABS: 1000.0000 mg | ORAL_TABLET | Freq: Once | ORAL | Status: DC | PRN

## 2021-06-02 MED ORDER — ACETAMINOPHEN 160 MG/5ML PO SOLN: 975.0000 mg | Freq: Once | ORAL | Status: DC | PRN

## 2021-06-02 MED ORDER — ONDANSETRON HCL 4 MG/2ML IJ SOLN: 4.0000 mg | Freq: Once | INTRAMUSCULAR | Status: DC | PRN

## 2021-06-02 MED ORDER — STERILE WATER FOR IRRIGATION IR SOLN
Status: DC | PRN
  Administered 2021-06-02: 1000 mL

## 2021-06-02 MED ORDER — PROPOFOL 10 MG/ML IV BOLUS
INTRAVENOUS | Status: DC | PRN
  Administered 2021-06-02: 30 mg via INTRAVENOUS
  Administered 2021-06-02 (×8): 50 mg via INTRAVENOUS

## 2021-06-02 MED ORDER — LACTATED RINGERS IV SOLN: INTRAVENOUS | Status: DC

## 2021-06-02 SURGICAL SUPPLY — 22 items
CLIP HMST 235XBRD CATH ROT (MISCELLANEOUS) IMPLANT
CLIP RESOLUTION 360 11X235 (MISCELLANEOUS)
ELECT REM PT RETURN 9FT ADLT (ELECTROSURGICAL)
ELECTRODE REM PT RTRN 9FT ADLT (ELECTROSURGICAL) IMPLANT
FORCEPS BIOP RAD 4 LRG CAP 4 (CUTTING FORCEPS) ×1 IMPLANT
GOWN CVR UNV OPN BCK APRN NK (MISCELLANEOUS) ×4 IMPLANT
GOWN ISOL THUMB LOOP REG UNIV (MISCELLANEOUS) ×4
INJECTOR VARIJECT VIN23 (MISCELLANEOUS) IMPLANT
KIT DEFENDO VALVE AND CONN (KITS) IMPLANT
KIT PRC NS LF DISP ENDO (KITS) ×2 IMPLANT
KIT PROCEDURE OLYMPUS (KITS) ×2
MANIFOLD NEPTUNE II (INSTRUMENTS) ×3 IMPLANT
MARKER SPOT ENDO TATTOO 5ML (MISCELLANEOUS) IMPLANT
PROBE APC STR FIRE (PROBE) IMPLANT
RETRIEVER NET ROTH 2.5X230 LF (MISCELLANEOUS) IMPLANT
SNARE COLD EXACTO (MISCELLANEOUS) ×1 IMPLANT
SNARE SHORT THROW 13M SML OVAL (MISCELLANEOUS) ×1 IMPLANT
SNARE SNG USE RND 15MM (INSTRUMENTS) IMPLANT
SPOT EX ENDOSCOPIC TATTOO (MISCELLANEOUS)
TRAP ETRAP POLY (MISCELLANEOUS) IMPLANT
VARIJECT INJECTOR VIN23 (MISCELLANEOUS)
WATER STERILE IRR 250ML POUR (IV SOLUTION) ×3 IMPLANT

## 2021-06-02 NOTE — Op Note (Signed)
[facility] ?Gastroenterology ?Patient Name: Shaun Colon ?Procedure Date: 06/02/2021 7:11 AM ?MRN: [id number] ?Account #: [id number] ?Date of Birth: 28-Jul-1970 ?Admit Type: Outpatient ?Age: 51 ?Room: [facility] OR ROOM 01 ?Gender: Male ?Note Status: Finalized ?Instrument Name: [id number] ?Procedure:             Colonoscopy ?Indications:           Iron deficiency anemia ?Providers:             Lucilla Lame MD, MD ?Referring MD:          Lynnell Jude (Referring MD) ?Medicines:             Propofol per Anesthesia ?Complications:         No immediate complications. ?Procedure:             Pre-Anesthesia Assessment: ?                       - Prior to the procedure, a History and Physical was  ?                       performed, and patient medications and allergies were  ?                       reviewed. The patient's tolerance of previous  ?                       anesthesia was also reviewed. The risks and benefits  ?                       of the procedure and the sedation options and risks  ?                       were discussed with the patient. All questions were  ?                       answered, and informed consent was obtained. Prior  ?                       Anticoagulants: The patient has taken no previous  ?                       anticoagulant or antiplatelet agents. ASA Grade  ?                       Assessment: II - A patient with mild systemic disease.  ?                       After reviewing the risks and benefits, the patient  ?                       was deemed in satisfactory condition to undergo the  ?                       procedure. ?                       After obtaining informed consent, the colonoscope was  ?  passed under direct vision. Throughout the procedure,  ?                       the patient's blood pressure, pulse, and oxygen  ?                       saturations were monitored continuously. The  ?                       Colonoscope was introduced through the  anus and  ?                       advanced to the the cecum, identified by appendiceal  ?                       orifice and ileocecal valve. The colonoscopy was  ?                       performed without difficulty. The patient tolerated  ?                       the procedure well. The quality of the bowel  ?                       preparation was excellent. ?Findings: ?     The perianal and digital rectal examinations were normal. ?     Two sessile polyps were found in the sigmoid colon. The polyps were 2 to  ?     6 mm in size. These polyps were removed with a cold snare. Resection and  ?     retrieval were complete. ?     Non-bleeding internal hemorrhoids were found during retroflexion. The  ?     hemorrhoids were Grade II (internal hemorrhoids that prolapse but reduce  ?     spontaneously). ?Impression:            - Two 2 to 6 mm polyps in the sigmoid colon, removed  ?                       with a cold snare. Resected and retrieved. ?                       - Non-bleeding internal hemorrhoids. ?Recommendation:        - Discharge patient to home. ?                       - Resume previous diet. ?                       - Continue present medications. ?                       - Await pathology results. ?                       - If the pathology report reveals adenomatous tissue,  ?                       then repeat the colonoscopy for surveillance in 7  ?  years. ?                       - To visualize the small bowel, perform video capsule  ?                       endoscopy at appointment to be scheduled. ?Procedure Code(s):     --- Professional --- ?                       [phone], Colonoscopy, flexible; with removal of  ?                       tumor(s), polyp(s), or other lesion(s) by snare  ?                       technique ?Diagnosis Code(s):     --- Professional --- ?                       D50.9, Iron deficiency anemia, unspecified ?                       K63.5, Polyp of colon ?CPT copyright 2019  American Medical Association. All rights reserved. ?The codes documented in this report are preliminary and upon coder review may  ?be revised to meet current compliance requirements. ?Lucilla Lame MD, MD ?06/02/2021 8:08:00 AM ?This report has been signed electronically. ?Number of Addenda: 0 ?Note Initiated On: 06/02/2021 7:11 AM ?Scope Withdrawal Time: 0 hours 8 minutes 11 seconds  ?Total Procedure Duration: 0 hours 14 minutes 34 seconds  ?Estimated Blood Loss:  Estimated blood loss: none. ?     [facility] ?

## 2021-06-02 NOTE — Op Note (Signed)
[facility] ?Gastroenterology ?Patient Name: Shaun Colon ?Procedure Date: 06/02/2021 7:14 AM ?MRN: [id number] ?Account #: [id number] ?Date of Birth: [birth year] ?Admit Type: Outpatient ?Age: 51 ?Room: [facility] OR ROOM 1 ?Gender: Male ?Note Status: Finalized ?Instrument Name: [id number] ?Procedure:             Upper GI endoscopy ?Indications:           Iron deficiency anemia ?Providers:             Lucilla Lame MD, MD ?Referring MD:          Lynnell Jude (Referring MD) ?Medicines:             Propofol per Anesthesia ?Complications:         No immediate complications. ?Procedure:             Pre-Anesthesia Assessment: ?                       - Prior to the procedure, a History and Physical was  ?                       performed, and patient medications and allergies were  ?                       reviewed. The patient's tolerance of previous  ?                       anesthesia was also reviewed. The risks and benefits  ?                       of the procedure and the sedation options and risks  ?                       were discussed with the patient. All questions were  ?                       answered, and informed consent was obtained. Prior  ?                       Anticoagulants: The patient has taken no previous  ?                       anticoagulant or antiplatelet agents. ASA Grade  ?                       Assessment: II - A patient with mild systemic disease.  ?                       After reviewing the risks and benefits, the patient  ?                       was deemed in satisfactory condition to undergo the  ?                       procedure. ?                       After obtaining informed consent, the endoscope was  ?  passed under direct vision. Throughout the procedure,  ?                       the patient's blood pressure, pulse, and oxygen  ?                       saturations were monitored continuously. The was  ?                       introduced through the mouth, and  advanced to the  ?                       second part of duodenum. The upper GI endoscopy was  ?                       accomplished without difficulty. The patient tolerated  ?                       the procedure well. ?Findings: ?     The examined esophagus was normal. ?     Localized minimal inflammation characterized by erythema was found in  ?     the gastric antrum. Biopsies were taken with a cold forceps for  ?     histology. ?     The examined duodenum was normal. Biopsies were taken with a cold  ?     forceps for histology. ?Impression:            - Normal esophagus. ?                       - Gastritis. Biopsied. ?                       - Normal examined duodenum. Biopsied. ?Recommendation:        - Discharge patient to home. ?                       - Resume previous diet. ?                       - Continue present medications. ?                       - Await pathology results. ?                       - Perform a colonoscopy today. ?Procedure Code(s):     --- Professional --- ?                       [id number], Esophagogastroduodenoscopy, flexible,  ?                       transoral; with biopsy, single or multiple ?Diagnosis Code(s):     --- Professional --- ?                       D50.9, Iron deficiency anemia, unspecified ?                       K29.70, Gastritis, unspecified, without bleeding ?CPT copyright 2019 American Medical Association. All rights reserved. ?The codes documented in this  report are preliminary and upon coder review may  ?be revised to meet current compliance requirements. ?Lucilla Lame MD, MD ?06/02/2021 7:47:04 AM ?This report has been signed electronically. ?Number of Addenda: 0 ?Note Initiated On: 06/02/2021 7:14 AM ?Total Procedure Duration: 0 hours 2 minutes 38 seconds  ?Estimated Blood Loss:  Estimated blood loss: none. ?     [facility] ?

## 2021-06-02 NOTE — Anesthesia Postprocedure Evaluation (Signed)
Anesthesia Post Note ? ?Patient: Shaun Colon ? ?Procedure(s) Performed: COLONOSCOPY ?ESOPHAGOGASTRODUODENOSCOPY (EGD) ? ? ?  ?Patient location during evaluation: PACU ?Anesthesia Type: General ?Level of consciousness: awake and alert ?Pain management: pain level controlled ?Vital Signs Assessment: post-procedure vital signs reviewed and stable ?Respiratory status: spontaneous breathing, nonlabored ventilation, respiratory function stable and patient connected to nasal cannula oxygen ?Cardiovascular status: blood pressure returned to baseline and stable ?Postop Assessment: no apparent nausea or vomiting ?Anesthetic complications: no ? ? ?No notable events documented. ? ?Trecia Rogers ? ? ? ? ? ?

## 2021-06-02 NOTE — Transfer of Care (Signed)
Immediate Anesthesia Transfer of Care Note ? ?Patient: RITA PROM ? ?Procedure(s) Performed: COLONOSCOPY ?ESOPHAGOGASTRODUODENOSCOPY (EGD) ? ?Patient Location: PACU ? ?Anesthesia Type: General ? ?Level of Consciousness: awake, alert  and patient cooperative ? ?Airway and Oxygen Therapy: Patient Spontanous Breathing and Patient connected to supplemental oxygen ? ?Post-op Assessment: Post-op Vital signs reviewed, Patient's Cardiovascular Status Stable, Respiratory Function Stable, Patent Airway and No signs of Nausea or vomiting ? ?Post-op Vital Signs: Reviewed and stable ? ?Complications: No notable events documented. ? ?

## 2021-06-02 NOTE — H&P (Signed)
? ?  Lucilla Lame, MD [facility] ?[address] ?[place], [place] [address] ?Phone:[phone] ?Fax : [phone] ? ?Primary Care Physician:  Lynnell Jude, MD ?Primary Gastroenterologist:  Dr. Allen Norris ? ?Pre-Procedure History & Physical: ?HPI:  Shaun Colon is a 51 y.o. male is here for an endoscopy and colonoscopy. ?  ?Past Medical History:  ?Diagnosis Date  ? Depression   ? GERD (gastroesophageal reflux disease)   ? History of alcohol abuse   ? 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 Substance abuse ([place])   ? ? ?Past Surgical History:  ?Procedure Laterality Date  ? MOUTH SURGERY    ? ? ?Prior to Admission medications   ?Medication Sig Start Date End Date Taking? Authorizing Provider  ?clonazePAM (KLONOPIN) 0.5 MG tablet Take 0.5 mg by mouth as needed for anxiety.   Yes [provider]  ?escitalopram (LEXAPRO) 20 MG tablet Take 20 mg by mouth daily. 08/20/14  Yes [provider]  ?gabapentin (NEURONTIN) 300 MG capsule Take 600 mg by mouth 3 (three) times daily. 08/20/14  Yes [provider]  ?QUEtiapine (SEROQUEL) 200 MG tablet Take 200 mg by mouth at bedtime.   Yes [provider]  ? ? ?Allergies as of 05/05/2021 - Review Complete 05/05/2021  ?Allergen Reaction Noted  ? Trazodone  08/15/2014  ? ? ?History reviewed. No pertinent family history. ? ?Social History  ? ?Socioeconomic History  ? Marital status: Married  ?  Spouse name: Not on file  ? Number of children: Not on file  ? Years of education: Not on file  ? Highest education level: Not on file  ?Occupational History  ? Not on file  ?Tobacco Use  ? Smoking status: Every Day  ?  Packs/day: 0.25  ?  Types: Cigarettes  ? Smokeless tobacco: Never  ? Tobacco comments:  ?  Started around 05/11/21  ?Vaping Use  ? Vaping Use: Never used  ?Substance and Sexual Activity  ? Alcohol use: Not Currently  ? Drug use: No  ? Sexual activity: Not on file  ?Other Topics Concern  ? Not on file  ?Social History Narrative   ? Not on file  ? ?Social Determinants of Health  ? ?Financial Resource Strain: Not on file  ?Food Insecurity: Not on file  ?Transportation Needs: Not on file  ?Physical Activity: Not on file  ?Stress: Not on file  ?Social Connections: Not on file  ?Intimate Partner Violence: Not on file  ? ? ?Review of Systems: ?See HPI, otherwise negative ROS ? ?Physical Exam: ?BP 110/73   Pulse (!) 55   Temp 98.5 ?F (36.9 ?C) (Temporal)   Resp (!) 22   Ht '5\' 10"'$  (1.778 m)   Wt 97.5 kg   SpO2 98%   BMI 30.85 kg/m?  ?General:   Alert,  pleasant and cooperative in NAD ?Head:  Normocephalic and atraumatic. ?Neck:  Supple; no masses or thyromegaly. ?Lungs:  Clear throughout to auscultation.    ?Heart:  Regular rate and rhythm. ?Abdomen:  Soft, nontender and nondistended. Normal bowel sounds, without guarding, and without rebound.   ?Neurologic:  Alert and  oriented x4;  grossly normal neurologically. ? ?Impression/Plan: ?[name] is here for an endoscopy and colonoscopy to be performed for IDA ? ?Risks, benefits, limitations, and alternatives regarding  endoscopy and colonoscopy have been reviewed with the patient.  Questions have been answered.  All parties agreeable. ? ? ?Lucilla Lame, MD  06/02/2021, 7:29 AM ?

## 2021-06-02 NOTE — Anesthesia Preprocedure Evaluation (Signed)
Anesthesia Evaluation  ?Patient identified by MRN, date of birth, ID band ?Patient awake ? ? ? ?Reviewed: ?Allergy & Precautions, H&P , NPO status , Patient's Chart, lab work & pertinent test results, reviewed documented beta blocker date and time  ? ?Airway ?Mallampati: II ? ?TM Distance: >3 FB ?Neck ROM: full ? ? ? Dental ?no notable dental hx. ? ?  ?Pulmonary ?Current Smoker,  ?  ?Pulmonary exam normal ?breath sounds clear to auscultation ? ? ? ? ? ? Cardiovascular ?Exercise Tolerance: Good ?negative cardio ROS ?Normal cardiovascular exam ?Rhythm:regular Rate:Normal ? ? ?  ?Neuro/Psych ?Hx of substance abuse with hx of alcohol withdrawal seizure. ?negative psych ROS  ? GI/Hepatic ?Neg liver ROS, GERD  ,  ?Endo/Other  ?negative endocrine ROS ? Renal/GU ?negative Renal ROS  ?negative genitourinary ?  ?Musculoskeletal ? ? Abdominal ?  ?Peds ? Hematology ?negative hematology ROS ?(+)   ?Anesthesia Other Findings ? ? Reproductive/Obstetrics ?negative OB ROS ? ?  ? ? ? ? ? ? ? ? ? ? ? ? ? ?  ?  ? ? ? ? ? ? ? ? ?Anesthesia Physical ?Anesthesia Plan ? ?ASA: 2 ? ?Anesthesia Plan: General  ? ?Post-op Pain Management:   ? ?Induction:  ? ?PONV Risk Score and Plan:  ? ?Airway Management Planned:  ? ?Additional Equipment:  ? ?Intra-op Plan:  ? ?Post-operative Plan:  ? ?Informed Consent: I have reviewed the patients History and Physical, chart, labs and discussed the procedure including the risks, benefits and alternatives for the proposed anesthesia with the patient or authorized representative who has indicated his/her understanding and acceptance.  ? ? ? ?Dental Advisory Given ? ?Plan Discussed with: CRNA and Anesthesiologist ? ?Anesthesia Plan Comments:   ? ? ? ? ? ? ?Anesthesia Quick Evaluation ? ?

## 2021-06-03 ENCOUNTER — Encounter: Payer: Self-pay | Admitting: Gastroenterology

## 2021-06-05 ENCOUNTER — Encounter: Payer: Self-pay | Admitting: Gastroenterology

## 2021-06-09 LAB — SURGICAL PATHOLOGY

## 2021-06-13 ENCOUNTER — Other Ambulatory Visit: Payer: Self-pay

## 2021-06-13 DIAGNOSIS — D500 Iron deficiency anemia secondary to blood loss (chronic): Secondary | ICD-10-CM

## 2021-07-28 ENCOUNTER — Ambulatory Visit
Admission: RE | Admit: 2021-07-28 | Payer: BC Managed Care – PPO | Source: Ambulatory Visit | Admitting: Gastroenterology

## 2021-07-28 ENCOUNTER — Encounter: Admission: RE | Payer: Self-pay | Source: Ambulatory Visit

## 2021-07-28 SURGERY — IMAGING PROCEDURE, GI TRACT, INTRALUMINAL, VIA CAPSULE

## 2023-06-16 ENCOUNTER — Emergency Department
Admission: EM | Admit: 2023-06-16 | Discharge: 2023-06-16 | Disposition: A | Discharge status: Home / Self Care | Attending: Emergency Medicine | Admitting: Emergency Medicine

## 2023-06-16 DIAGNOSIS — F1010 Alcohol abuse, uncomplicated: Secondary | ICD-10-CM | POA: Diagnosis present

## 2023-06-16 NOTE — ED Provider Notes (Signed)
   [facility] Provider Note    Event Date/Time   First MD Initiated Contact with Patient 06/16/23 [phone]     (approximate)   History   Medical Clearance   HPI  Shaun Colon is a 53 y.o. male  who presents to the emergency department today in police custody because of concern for possible SI. Per report the patient was arrested for DUI. Once in police custody the patient stated he had thoughts of wanting to hurt himself. The patient denied any plan. On exam patient does not want to answer any questions.       Physical Exam   Triage Vital Signs: ED Triage Vitals  Encounter Vitals Group     BP 06/16/23 0717 (!) 132/98     Systolic BP Percentile --      Diastolic BP Percentile --      Pulse Rate 06/16/23 0717 (!) 56     Resp 06/16/23 0717 18     Temp 06/16/23 0717 97.6 F (36.4 C)     Temp Source 06/16/23 0717 Oral     SpO2 06/16/23 0717 100 %     Weight 06/16/23 0711 200 lb (90.7 kg)     Height 06/16/23 0711 5\' 10"  (1.778 m)     Head Circumference --      Peak Flow --      Pain Score 06/16/23 0711 0     Pain Loc --      Pain Education --      Exclude from Growth Chart --     Most recent vital signs: Vitals:   06/16/23 0717  BP: (!) 132/98  Pulse: (!) 56  Resp: 18  Temp: 97.6 F (36.4 C)  SpO2: 100%   General: Awake, alert. CV:  Good peripheral perfusion. Regular rate and rhythm. Resp:  Normal effort. Lungs clear. Abd:  No distention.    ED Results / Procedures / Treatments   Labs (all labs ordered are listed, but only abnormal results are displayed) Labs Reviewed - No data to display   EKG  None   RADIOLOGY None   PROCEDURES:  Critical Care performed: No   MEDICATIONS ORDERED IN ED: Medications - No data to display   IMPRESSION / MDM / ASSESSMENT AND PLAN / ED COURSE  I reviewed the triage vital signs and the nursing notes.                              Differential diagnosis includes, but is not limited to,  depression, drug induced mood disorder, malingering.   Patient's presentation is most consistent with acute presentation with potential threat to life or bodily function.  Patient presented to the emergency department today under police custody for concern for possible SI. Per chart review patient does have history of depression. Patient not willing to answer questions for me at this time. Given that patient will be discharged under police custody and to jail where he can be observed I think it is reasonable for patient to be discharged.       FINAL CLINICAL IMPRESSION(S) / ED DIAGNOSES   Final diagnoses:  Alcohol abuse     Note:  This document was prepared using Dragon voice recognition software and may include unintentional dictation errors.    Marylynn Soho, MD 06/16/23 [phone]

## 2023-06-16 NOTE — ED Triage Notes (Signed)
 Pt presents to the ED via [facility] from PD. Pt was arrested for DUI. Unsure if this is ETOH or drug related.  Upon suicide screening questions pt states that he has thoughts of killing himself. This RN asked patient when these thoughts started. Pt responded "when I got arrested". Pt states that he does not have a plan. States that he was previously hospitalized for slitting his wrists and overdose, but he can't remember when that occurred.  Pt accompanied by PD and in cuffs at this time.

## 2023-07-15 ENCOUNTER — Telehealth: Payer: Self-pay

## 2023-07-15 ENCOUNTER — Ambulatory Visit (INDEPENDENT_AMBULATORY_CARE_PROVIDER_SITE_OTHER): Payer: MEDICAID

## 2023-07-15 VITALS — BP 115/75 | HR 65 | Resp 18 | Ht 70.0 in | Wt 199.1 lb

## 2023-07-15 DIAGNOSIS — G629 Polyneuropathy, unspecified: Secondary | ICD-10-CM | POA: Diagnosis not present

## 2023-07-15 DIAGNOSIS — M159 Polyosteoarthritis, unspecified: Secondary | ICD-10-CM

## 2023-07-15 DIAGNOSIS — F17200 Nicotine dependence, unspecified, uncomplicated: Secondary | ICD-10-CM | POA: Insufficient documentation

## 2023-07-15 MED ORDER — IBUPROFEN 400 MG PO TABS
400.0000 mg | ORAL_TABLET | Freq: Four times a day (QID) | ORAL | 3 refills | Status: DC | PRN
Start: 2023-07-15 — End: 2023-07-19

## 2023-07-15 MED ORDER — GABAPENTIN 300 MG PO CAPS: 300.0000 mg | ORAL_CAPSULE | Freq: Three times a day (TID) | ORAL | 3 refills | Status: DC

## 2023-07-15 MED ORDER — GABAPENTIN 300 MG PO CAPS
300.0000 mg | ORAL_CAPSULE | Freq: Three times a day (TID) | ORAL | 3 refills | Status: DC
Start: 2023-07-15 — End: 2023-07-19

## 2023-07-15 MED ORDER — IBUPROFEN 400 MG PO TABS: 400.0000 mg | ORAL_TABLET | Freq: Four times a day (QID) | ORAL | 3 refills | Status: DC | PRN

## 2023-07-15 NOTE — Telephone Encounter (Signed)
 Copied from CRM [id number]. Topic: Clinical - Prescription Issue >> Jul 15, 2023  9:58 AM Hobson Luna F wrote: Reason for CRM: Theadore Finger, Group home director is calling in because patient's medication was sent to the wrong pharmacy. The prescriptions are gabapentin (NEURONTIN) 300 MG capsule [[id number]], ibuprofen  (ADVIL ) 400 MG tablet [[id number]]. They need to be sent to [facility] in [place].

## 2023-07-15 NOTE — Progress Notes (Unsigned)
 New Patient Office Visit  Subjective    Patient ID: Shaun Colon, male    DOB: 11-01-1970  Age: 53 y.o. MRN: [id number]  CC:  Chief Complaint  Patient presents with   Establish Care    New pt appt    HPI DESTEN MANOR presents to establish care Needs refill of medications for chronic neuropathy and OA.    Outpatient Encounter Medications as of 07/15/2023  Medication Sig   buPROPion (WELLBUTRIN XL) 150 MG 24 hr tablet Take 150 mg by mouth daily.   escitalopram (LEXAPRO) 20 MG tablet Take 20 mg by mouth daily.   nicotine (NICODERM CQ - DOSED IN MG/24 HOURS) 21 mg/24hr patch Place 21 mg onto the skin daily.   QUEtiapine (SEROQUEL) 200 MG tablet Take 200 mg by mouth at bedtime.   [DISCONTINUED] gabapentin (NEURONTIN) 300 MG capsule Take 600 mg by mouth 3 (three) times daily.   [DISCONTINUED] ibuprofen  (ADVIL ) 400 MG tablet Take 400 mg by mouth every 6 (six) hours as needed.   gabapentin (NEURONTIN) 300 MG capsule Take 1 capsule (300 mg total) by mouth 3 (three) times daily.   ibuprofen  (ADVIL ) 400 MG tablet Take 1 tablet (400 mg total) by mouth every 6 (six) hours as needed.   [DISCONTINUED] clonazePAM (KLONOPIN) 0.5 MG tablet Take 0.5 mg by mouth as needed for anxiety. (Patient not taking: Reported on 07/15/2023)   [DISCONTINUED] gabapentin (NEURONTIN) 300 MG capsule Take 1 capsule (300 mg total) by mouth 3 (three) times daily.   [DISCONTINUED] ibuprofen  (ADVIL ) 400 MG tablet Take 1 tablet (400 mg total) by mouth every 6 (six) hours as needed.   No facility-administered encounter medications on file as of 07/15/2023.    Past Medical History:  Diagnosis Date   Depression    GERD (gastroesophageal reflux disease)    History of alcohol abuse    Seizure due to alcohol withdrawal, uncomplicated ([facility]) 2014   Substance abuse ([facility])     Past Surgical History:  Procedure Laterality Date   COLONOSCOPY N/A 06/02/2021   Procedure: COLONOSCOPY;  Surgeon: Marnee Sink, MD;  Location:  [facility];  Service: Endoscopy;  Laterality: N/A;   ESOPHAGOGASTRODUODENOSCOPY N/A 06/02/2021   Procedure: ESOPHAGOGASTRODUODENOSCOPY (EGD);  Surgeon: Marnee Sink, MD;  Location: [facility];  Service: Endoscopy;  Laterality: N/A;   MOUTH SURGERY      History reviewed. No pertinent family history.  Social History   Socioeconomic History   Marital status: Married    Spouse name: Not on file   Number of children: Not on file   Years of education: Not on file   Highest education level: Not on file  Occupational History   Not on file  Tobacco Use   Smoking status: Every Day    Current packs/day: 0.25    Types: Cigarettes   Smokeless tobacco: Never   Tobacco comments:    Started around 05/11/21  Vaping Use   Vaping status: Never Used  Substance and Sexual Activity   Alcohol use: Not Currently   Drug use: No   Sexual activity: Not on file  Other Topics Concern   Not on file  Social History Narrative   Not on file   Social Drivers of Health   Financial Resource Strain: Not on file  Food Insecurity: No Food Insecurity (07/15/2023)   Hunger Vital Sign    Worried About Running Out of Food in the Last Year: Never true    Ran Out of Food in the  Last Year: Never true  Transportation Needs: No Transportation Needs (07/15/2023)   PRAPARE - Administrator, Civil Service (Medical): No    Lack of Transportation (Non-Medical): No  Physical Activity: Insufficiently Active (07/15/2023)   Exercise Vital Sign    Days of Exercise per Week: 3 days    Minutes of Exercise per Session: 30 min  Stress: Not on file  Social Connections: Unknown (07/07/2021)   Received from [facility]   Social Network    Social Network: Not on file  Intimate Partner Violence: Not At Risk (07/15/2023)   Humiliation, Afraid, Rape, and Kick questionnaire    Fear of Current or Ex-Partner: No    Emotionally Abused: No    Physically Abused: No    Sexually Abused: No    ROS       Objective    BP 115/75   Pulse 65   Resp 18   Ht 5\' 10"  (1.778 m)   Wt 199 lb 1.9 oz (90.3 kg)   SpO2 97%   BMI 28.57 kg/m   Physical Exam Vitals and nursing note reviewed.  Constitutional:      Appearance: Normal appearance.  Eyes:     Extraocular Movements: Extraocular movements intact.     Pupils: Pupils are equal, round, and reactive to light.  Cardiovascular:     Rate and Rhythm: Normal rate and regular rhythm.  Pulmonary:     Effort: Pulmonary effort is normal.     Breath sounds: Normal breath sounds.  Neurological:     Mental Status: He is alert and oriented to person, place, and time.  Psychiatric:        Mood and Affect: Mood normal.        Thought Content: Thought content normal.         Assessment & Plan:   Problem List Items Addressed This Visit   None Visit Diagnoses       Neuropathy    -  Primary   Relevant Medications   gabapentin (NEURONTIN) 300 MG capsule     Generalized OA       Relevant Medications   ibuprofen  (ADVIL ) 400 MG tablet       No follow-ups on file.   Alison Irvine, FNP

## 2023-07-19 MED ORDER — GABAPENTIN 300 MG PO CAPS: 300.0000 mg | ORAL_CAPSULE | Freq: Three times a day (TID) | ORAL | 3 refills | Status: AC

## 2023-07-19 MED ORDER — IBUPROFEN 400 MG PO TABS: 400.0000 mg | ORAL_TABLET | Freq: Four times a day (QID) | ORAL | 3 refills | Status: AC | PRN

## 2023-07-20 ENCOUNTER — Telehealth: Payer: Self-pay

## 2023-07-20 NOTE — Telephone Encounter (Signed)
 Copied from CRM [id number]. Topic: Clinical - Request for Lab/Test Order >> Jul 20, 2023 10:06 AM Jenna Moan wrote: Reason for CRM: Theadore Finger group home director from [facility] [phone] called to inform Alison Irvine that patient needs blood test order requested from patient psychiatrist Rex Castor at [facility] [phone], if there are any questions regarding what type of test in detail you can call Anee kaur or for any other questions call Theadore Finger

## 2023-07-20 NOTE — Telephone Encounter (Signed)
 CBC with Differential, Lipid Panel, CMP, Hemoglobin A1C, and Vitamin D Hydroxy

## 2023-07-21 ENCOUNTER — Other Ambulatory Visit: Payer: Self-pay

## 2023-07-21 DIAGNOSIS — D500 Iron deficiency anemia secondary to blood loss (chronic): Secondary | ICD-10-CM

## 2023-07-21 DIAGNOSIS — G629 Polyneuropathy, unspecified: Secondary | ICD-10-CM

## 2023-07-21 DIAGNOSIS — Z131 Encounter for screening for diabetes mellitus: Secondary | ICD-10-CM

## 2023-07-21 DIAGNOSIS — Z13220 Encounter for screening for lipoid disorders: Secondary | ICD-10-CM

## 2023-08-17 ENCOUNTER — Telehealth: Payer: Self-pay

## 2023-08-17 NOTE — Telephone Encounter (Signed)
 Noted

## 2023-08-17 NOTE — Telephone Encounter (Signed)
 Copied from CRM [id number]. Topic: Clinical - Request for Lab/Test Order >> Aug 17, 2023 10:58 AM Essie A wrote: Reason for CRM: Dewayne from [facility] called to let you know that he will contact patient to make sure he gets he labs done.

## 2023-08-20 LAB — IRON,TIBC AND FERRITIN PANEL
Ferritin: 115 ng/mL (ref 30–400)
Iron Saturation: 29 % (ref 15–55)
Iron: 81 ug/dL (ref 38–169)
Total Iron Binding Capacity: 277 ug/dL (ref 250–450)
UIBC: 196 ug/dL (ref 111–343)

## 2023-08-20 LAB — CMP14+EGFR
ALT: 9 IU/L (ref 0–44)
AST: 8 IU/L (ref 0–40)
Albumin: 4.2 g/dL (ref 3.8–4.9)
Alkaline Phosphatase: 54 IU/L (ref 44–121)
BUN/Creatinine Ratio: 6 — ABNORMAL LOW (ref 9–20)
BUN: 8 mg/dL (ref 6–24)
Bilirubin Total: 0.3 mg/dL (ref 0.0–1.2)
CO2: 23 mmol/L (ref 20–29)
Calcium: 8.8 mg/dL (ref 8.7–10.2)
Chloride: 103 mmol/L (ref 96–106)
Creatinine, Ser: 1.27 mg/dL (ref 0.76–1.27)
Globulin, Total: 2.4 g/dL (ref 1.5–4.5)
Glucose: 96 mg/dL (ref 70–99)
Potassium: 4.8 mmol/L (ref 3.5–5.2)
Sodium: 141 mmol/L (ref 134–144)
Total Protein: 6.6 g/dL (ref 6.0–8.5)
eGFR: 68 mL/min/{1.73_m2} (ref >59)

## 2023-08-20 LAB — LIPID PANEL
Chol/HDL Ratio: 4.7 ratio (ref 0.0–5.0)
Cholesterol, Total: 240 mg/dL — ABNORMAL HIGH (ref 100–199)
HDL: 51 mg/dL (ref >39)
LDL Chol Calc (NIH): 166 mg/dL — ABNORMAL HIGH (ref 0–99)
Triglycerides: 126 mg/dL (ref 0–149)
VLDL Cholesterol Cal: 23 mg/dL (ref 5–40)

## 2023-08-20 LAB — HEMOGLOBIN A1C
Est. average glucose Bld gHb Est-mCnc: 117 mg/dL
Hgb A1c MFr Bld: 5.7 % — ABNORMAL HIGH (ref 4.8–5.6)

## 2023-08-20 LAB — CBC WITH DIFFERENTIAL/PLATELET
Basophils Absolute: 0.1 10*3/uL (ref 0.0–0.2)
Basos: 2 %
EOS (ABSOLUTE): 0.1 10*3/uL (ref 0.0–0.4)
Eos: 3 %
Hematocrit: 40.9 % (ref 37.5–51.0)
Hemoglobin: 13 g/dL (ref 13.0–17.7)
Immature Grans (Abs): 0 10*3/uL (ref 0.0–0.1)
Immature Granulocytes: 0 %
Lymphocytes Absolute: 2.4 10*3/uL (ref 0.7–3.1)
Lymphs: 49 %
MCH: 26.4 pg — ABNORMAL LOW (ref 26.6–33.0)
MCHC: 31.8 g/dL (ref 31.5–35.7)
MCV: 83 fL (ref 79–97)
Monocytes Absolute: 0.4 10*3/uL (ref 0.1–0.9)
Monocytes: 8 %
Neutrophils Absolute: 1.9 10*3/uL (ref 1.4–7.0)
Neutrophils: 38 %
Platelets: 180 10*3/uL (ref 150–450)
RBC: 4.92 x10E6/uL (ref 4.14–5.80)
RDW: 14 % (ref 11.6–15.4)
WBC: 4.9 10*3/uL (ref 3.4–10.8)

## 2023-08-20 LAB — B12 AND FOLATE PANEL
Folate: 5 ng/mL (ref >3.0)
Vitamin B-12: 743 pg/mL (ref 232–1245)

## 2023-08-20 LAB — VITAMIN D 25 HYDROXY (VIT D DEFICIENCY, FRACTURES): Vit D, 25-Hydroxy: 12.3 ng/mL — ABNORMAL LOW (ref 30.0–100.0)

## 2023-08-23 ENCOUNTER — Other Ambulatory Visit: Payer: Self-pay

## 2023-08-23 ENCOUNTER — Ambulatory Visit: Payer: Self-pay

## 2023-08-23 DIAGNOSIS — E559 Vitamin D deficiency, unspecified: Secondary | ICD-10-CM

## 2023-08-23 DIAGNOSIS — E782 Mixed hyperlipidemia: Secondary | ICD-10-CM

## 2023-08-23 MED ORDER — ATORVASTATIN CALCIUM 20 MG PO TABS: 20.0000 mg | ORAL_TABLET | Freq: Every day | ORAL | 1 refills | Status: AC

## 2023-08-23 MED ORDER — VITAMIN D (ERGOCALCIFEROL) 1.25 MG (50000 UNIT) PO CAPS: 50000.0000 [IU] | ORAL_CAPSULE | ORAL | 5 refills | Status: AC

## 2023-10-04 ENCOUNTER — Telehealth: Payer: Self-pay

## 2023-10-04 NOTE — Transitions of Care (Post Inpatient/ED Visit) (Signed)
 10/04/2023  Name: Shaun Colon MRN: [id number] DOB: [birth year]  Today's TOC FU Call Status: Today's TOC FU Call Status:: Successful TOC FU Call Completed TOC FU Call Complete Date: 10/04/23 Patient's Name and Date of Birth confirmed.  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 Sig Documenting Provider Last Dose Status Informant  atorvastatin  (LIPITOR) 20 MG tablet [id number] Yes Take 1 tablet (20 mg total) by mouth daily. Bevely Doffing, FNP  Active   buPROPion (WELLBUTRIN XL) 150 MG 24 hr tablet [id number] Yes Take 150 mg by mouth daily. [provider]  Active   escitalopram (LEXAPRO) 20 MG tablet [id number] Yes Take 20 mg by mouth daily. [provider]  Active Self  folic acid (FOLVITE) 1 MG tablet [id number] Yes Take 1 mg by mouth daily. [provider]  Active   gabapentin  (NEURONTIN ) 300 MG capsule [id number] Yes Take 1 capsule (300 mg total) by mouth 3 (three) times daily. Bevely Doffing, FNP  Active   ibuprofen  (ADVIL ) 400 MG tablet [id number] Yes Take 1 tablet (400 mg total) by mouth every 6 (six) hours as needed. Bevely Doffing, FNP  Active   naloxone  (NARCAN ) nasal spray 4 mg/0.1 mL [id number] Yes Place 1 spray into the nose once. [provider]   Active   naltrexone (DEPADE) 50 MG tablet [id number] Yes Take 50 mg by mouth daily. [provider]  Active   nicotine (NICODERM CQ - DOSED IN MG/24 HOURS) 21 mg/24hr patch [id number] Yes Place 21 mg onto the skin daily. [provider]  Active   QUEtiapine (SEROQUEL) 200 MG tablet [id number] Yes Take 200 mg by mouth at bedtime. [provider]  Active Self  thiamine (VITAMIN B1) 100 MG tablet [id number] Yes Take 100 mg by mouth daily. [provider]  Active   Vitamin D , Ergocalciferol , (DRISDOL ) 1.25 MG (50000 UNIT) CAPS capsule [id number] Yes Take 1 capsule (50,000 Units total) by mouth every 7 (seven) days. Bevely Doffing, FNP  Active             Home Care and Equipment/Supplies: Were Home Health Services Ordered?: NA Any new equipment or medical supplies ordered?: NA  Functional Questionnaire: Do you need assistance with bathing/showering or dressing?: No Do you need assistance with meal preparation?: No Do you need assistance with eating?: No Do you have difficulty maintaining continence: No Do you need assistance with getting out of bed/getting out of a chair/moving?: No Do you have difficulty managing or taking your medications?: No  Follow up appointments reviewed: PCP Follow-up appointment confirmed?: Yes Date of PCP follow-up appointment?: 10/18/23 Follow-up Provider: Doffing Bevely (patient requested appointment further out) Specialist Hospital Follow-up appointment confirmed?: NA Do you need transportation to your follow-up appointment?: No Do you understand care options if your condition(s) worsen?: Yes-patient verbalized understanding    SIGNATURE Charmaine Lavelle, LPN [facility] Health Advisor [place] [facility] You Are. We Are. One  [facility] Direct Dial [phone]

## 2023-10-18 ENCOUNTER — Inpatient Hospital Stay: Payer: MEDICAID

## 2024-01-14 ENCOUNTER — Ambulatory Visit: Payer: MEDICAID
